# Patient Record
Sex: Male | Born: 1950
Health system: Southern US, Community
[De-identification: ages and names within clinical notes are randomized; demographics above are authoritative.]

## PROBLEM LIST (undated history)

## (undated) DIAGNOSIS — J449 Chronic obstructive pulmonary disease, unspecified: Secondary | ICD-10-CM

## (undated) DIAGNOSIS — M1990 Unspecified osteoarthritis, unspecified site: Secondary | ICD-10-CM

## (undated) DIAGNOSIS — F419 Anxiety disorder, unspecified: Secondary | ICD-10-CM

## (undated) DIAGNOSIS — D649 Anemia, unspecified: Secondary | ICD-10-CM

## (undated) DIAGNOSIS — I10 Essential (primary) hypertension: Secondary | ICD-10-CM

## (undated) DIAGNOSIS — T7840XA Allergy, unspecified, initial encounter: Secondary | ICD-10-CM

## (undated) DIAGNOSIS — E785 Hyperlipidemia, unspecified: Secondary | ICD-10-CM

## (undated) HISTORY — DX: Unspecified osteoarthritis, unspecified site: M19.90

## (undated) HISTORY — PX: HERNIA REPAIR: SHX51

## (undated) HISTORY — PX: COLON SURGERY: SHX602

## (undated) HISTORY — PX: EYE SURGERY: SHX253

## (undated) HISTORY — DX: Allergy, unspecified, initial encounter: T78.40XA

## (undated) HISTORY — DX: Anxiety disorder, unspecified: F41.9

## (undated) HISTORY — DX: Anemia, unspecified: D64.9

## (undated) HISTORY — DX: Chronic obstructive pulmonary disease, unspecified: J44.9

---

## 2011-05-08 ENCOUNTER — Emergency Department
Admission: EM | Admit: 2011-05-08 | Discharge: 2011-05-08 | Disposition: A | Discharge status: Home / Self Care | Payer: BC Managed Care – PPO | Source: Home / Self Care | Attending: Family Medicine | Admitting: Family Medicine

## 2011-05-08 DIAGNOSIS — J0100 Acute maxillary sinusitis, unspecified: Secondary | ICD-10-CM

## 2011-05-08 HISTORY — DX: Hyperlipidemia, unspecified: E78.5

## 2011-05-08 MED ORDER — AMOXICILLIN 875 MG PO TABS: 875.0000 mg | ORAL_TABLET | Freq: Two times a day (BID) | ORAL | Status: AC

## 2011-05-08 NOTE — Discharge Instructions (Signed)
Take plain Mucinex (guaifenesin) twice daily for cough and congestion.  Add Sudafed if blood pressure not affected.  Increase fluid intake, rest. May use Afrin nasal spray (or generic oxymetazoline) twice daily for about 5 days.  Also recommend using saline nasal spray several times daily and saline nasal irrigation (AYR is a common brand) Stop all antihistamines for now, and other non-prescription cough/cold preparations. May take Ibuprofen 200mg , 4 tabs every 8 hours with food as needed.    Marland Kitchen

## 2011-05-08 NOTE — ED Provider Notes (Signed)
History     CSN: [id number]  Arrival date & time 05/08/11  1627   First MD Initiated Contact with Patient 05/08/11 1751      Chief Complaint  Patient presents with  . Headache     HPI Comments: Patient complains of 5 day history of constant headache in his left temporal area and behind left eye.  He believes that he may have a sinus infection.  Two weeks prior to onset of headache he had a left upper toothache that resolved spontaneously.  He does not have definite sinus congestion, but has noted occasional "crackling" sensation behind his left eye at night.  No recent URI symptoms.  His left ear feels slightly clogged.  He had sweats two days ago.  He notes that Ibuprofen 600mg  is helpful.  No other neurologic symptoms.  The headache does not affect his sleep.    Patient is a 61 y.o. male presenting with headaches. The history is provided by the patient.  Headache The primary symptoms include headaches and fever. Primary symptoms do not include dizziness, visual change, paresthesias, focal weakness, loss of sensation, speech change, memory loss, nausea or vomiting. Episode onset: 5 days ago. The symptoms are unchanged. The neurological symptoms are focal.  The headache is associated with photophobia. The headache is not associated with aura, visual change, neck stiffness, paresthesias or weakness.  Additional symptoms include photophobia. Additional symptoms do not include neck stiffness, weakness, aura, nystagmus, taste disturbance or vertigo.    Past Medical History  Diagnosis Date  . Hyperlipidemia     Past Surgical History  Procedure Date  . Hernia repair     Family History  Problem Relation Age of Onset  . Hypertension Mother     History  Substance Use Topics  . Smoking status: Current Everyday Smoker -- 0.5 packs/day  . Smokeless tobacco: Not on file  . Alcohol Use: Yes      Review of Systems  Constitutional: Positive for fever and chills.  HENT: Positive for  congestion. Negative for facial swelling, rhinorrhea and neck stiffness.   Eyes: Positive for photophobia.  Gastrointestinal: Negative for nausea and vomiting.  Neurological: Positive for headaches. Negative for dizziness, vertigo, speech change, focal weakness, weakness and paresthesias.  Psychiatric/Behavioral: Negative for memory loss.  All other systems reviewed and are negative.    Allergies  Review of patient's allergies indicates no known allergies.  Home Medications   Current Outpatient Rx  Name Route Sig Dispense Refill  . ASPIRIN 81 MG PO TABS Oral Take 81 mg by mouth daily.    . AMOXICILLIN 875 MG PO TABS Oral Take 1 tablet (875 mg total) by mouth 2 (two) times daily. 20 tablet 0    BP 159/94  Pulse 95  Temp(Src) 99.2 F (37.3 C) (Oral)  Resp 20  Ht 6\' 4"  (1.93 m)  Wt 187 lb 8 oz (85.049 kg)  BMI 22.82 kg/m2  SpO2 96%  Physical Exam Nursing notes and Vital Signs reviewed. Appearance:  Patient appears healthy, stated age, and in no acute distress Head:  There is vague tenderness over left temporal area, although left temporal artery is not distinctly tender to palpation. Eyes:  Pupils are equal, round, and reactive to light and accomodation.  Extraocular movement is intact.  Conjunctivae are not inflamed.  Fundi are benign.  No photophobia.  Ears:  Canals normal.  Tympanic membranes normal.  No TMJ tenderness on left. Nose:  Congested turbinates.  Mild maxillary sinus tenderness is present.  Mouth:  No lesions.  No tooth or gingiva tenderness to palpation.  Teeth in good repair. Pharynx:  Normal Neck:  Supple.  No adenopathy Lungs:  Clear to auscultation.  Breath sounds are equal.  Heart:  Regular rate and rhythm without murmurs, rubs, or gallops.  Skin (head/neck):  No rash Neurologic:  Cranial nerves 2 through 12 are normal.  Patellar and elbow reflexes are normal.  Cerebellar function is intact (finger-to-nose and rapid alternating hand movement).  Gait and  station are normal.       ED Course  Procedures  none      1. Acute maxillary sinusitis   2. Left-sided headache       MDM  Patient declines sinus films at this time. Temporal arteritis is also a consideration; but patient declines sed rate at this time and wishes to try treatment for sinusitis. Begin amoxicillin for 10 days. Take plain Mucinex (guaifenesin) twice daily for cough and congestion.  Add Sudafed if blood pressure not affected.  Increase fluid intake, rest. May use Afrin nasal spray (or generic oxymetazoline) twice daily for about 5 days.  Also recommend using saline nasal spray several times daily and saline nasal irrigation (AYR is a common brand) Stop all antihistamines for now, and other non-prescription cough/cold preparations. May take Ibuprofen 200mg , 4 tabs every 8 hours with food as needed. Followup with ENT if not improved in one week.            Lattie Haw, MD 05/08/11 [phone]

## 2011-05-08 NOTE — ED Notes (Signed)
Headache since Sunday, rates pain 7/10 . States pain is behind left eye. Hx of sinus infection.

## 2011-05-10 ENCOUNTER — Telehealth: Payer: Self-pay | Admitting: Family Medicine

## 2012-01-29 ENCOUNTER — Encounter: Payer: Self-pay | Admitting: *Deleted

## 2012-01-29 ENCOUNTER — Emergency Department
Admission: EM | Admit: 2012-01-29 | Discharge: 2012-01-29 | Disposition: A | Discharge status: Home / Self Care | Payer: BC Managed Care – PPO | Source: Home / Self Care | Attending: Family Medicine | Admitting: Family Medicine

## 2012-01-29 DIAGNOSIS — J209 Acute bronchitis, unspecified: Secondary | ICD-10-CM

## 2012-01-29 MED ORDER — HYDROCOD POLST-CHLORPHEN POLST 10-8 MG/5ML PO LQCR: 5.0000 mL | Freq: Every evening | ORAL | Status: DC | PRN

## 2012-01-29 MED ORDER — DOXYCYCLINE HYCLATE 100 MG PO CAPS: 100.0000 mg | ORAL_CAPSULE | Freq: Two times a day (BID) | ORAL | Status: DC

## 2012-01-29 NOTE — ED Provider Notes (Signed)
History     CSN: [id number]  Arrival date & time 01/29/12  1616   First MD Initiated Contact with Patient 01/29/12 1729      Chief Complaint  Patient presents with  . Cough     HPI Comments: Patient complains of approximately 2 week history of gradually progressive URI symptoms beginning with a mild sore throat (now improved), followed by progressive nasal congestion.  A cough started next and has persisted.   Complains of initial fatigue but no myalgias.  Cough is now worse at night and generally non-productive during the day.  There has been no pleuritic pain, shortness of breath, or wheezes.   The history is provided by the patient.    Past Medical History  Diagnosis Date  . Hyperlipidemia     Past Surgical History  Procedure Date  . Hernia repair     Family History  Problem Relation Age of Onset  . Hypertension Mother     History  Substance Use Topics  . Smoking status: Current Every Day Smoker -- 0.5 packs/day  . Smokeless tobacco: Not on file  . Alcohol Use: Yes      Review of Systems No sore throat at present + cough No pleuritic pain No wheezing + nasal congestion ? post-nasal drainage No sinus pain/pressure No itchy/red eyes No earache No hemoptysis No SOB No fever/chills No nausea No vomiting No abdominal pain No diarrhea No urinary symptoms No skin rashes No fatigue No myalgias No headache   Allergies  Review of patient's allergies indicates no known allergies.  Home Medications   Current Outpatient Rx  Name  Route  Sig  Dispense  Refill  . ASPIRIN 81 MG PO TABS   Oral   Take 81 mg by mouth daily.         Marland Kitchen HYDROCOD POLST-CPM POLST ER 10-8 MG/5ML PO LQCR   Oral   Take 5 mLs by mouth at bedtime as needed.   115 mL   0   . DOXYCYCLINE HYCLATE 100 MG PO CAPS   Oral   Take 1 capsule (100 mg total) by mouth 2 (two) times daily.   20 capsule   0     BP 146/89  Pulse 74  Temp 98 F (36.7 C) (Oral)  Resp 18  Ht 6\' 4"   (1.93 m)  Wt 186 lb (84.369 kg)  BMI 22.64 kg/m2  SpO2 99%  Physical Exam Nursing notes and Vital Signs reviewed. Appearance:  Patient appears stated age, and in no acute distress Eyes:  Pupils are equal, round, and reactive to light and accomodation.  Extraocular movement is intact.  Conjunctivae are not inflamed  Ears:  Canals normal.  Tympanic membranes normal.  Nose:  Mildly congested turbinates.  No sinus tenderness.   Pharynx:  Normal Neck:  Supple.   No adenopathy Lungs:  Clear to auscultation.  Breath sounds are equal.  Heart:  Regular rate and rhythm without murmurs, rubs, or gallops.  Abdomen:  Nontender without masses or hepatosplenomegaly.  Bowel sounds are present.  No CVA or flank tenderness.  Extremities:  No edema.  Skin:  No rash present.   ED Course  Procedures  none      1. Acute bronchitis       MDM  Begin doxycycline.  Tussionex for bedtime cough. Take plain Mucinex (guaifenesin) twice daily for cough and congestion.  Increase fluid intake, rest. May use Afrin nasal spray (or generic oxymetazoline) twice daily for about 5 days.  Also recommend using saline nasal spray several times daily and saline nasal irrigation (AYR is a common brand) Stop all antihistamines for now, and other non-prescription cough/cold preparations. Follow-up with family doctor in about two weeks to re-check blood pressure.           Lattie Haw, MD 01/29/12 [phone]

## 2012-01-29 NOTE — ED Notes (Signed)
Pt c/o productive cough and chest congestion x 01/12/12. Denies fever. He has taken Robt with some relief.

## 2012-01-31 ENCOUNTER — Telehealth: Payer: Self-pay | Admitting: Emergency Medicine

## 2012-02-05 ENCOUNTER — Ambulatory Visit: Payer: BC Managed Care – PPO | Admitting: Sports Medicine

## 2012-03-22 ENCOUNTER — Ambulatory Visit (INDEPENDENT_AMBULATORY_CARE_PROVIDER_SITE_OTHER): Payer: BC Managed Care – PPO | Admitting: Sports Medicine

## 2012-03-22 ENCOUNTER — Emergency Department (INDEPENDENT_AMBULATORY_CARE_PROVIDER_SITE_OTHER): Payer: BC Managed Care – PPO

## 2012-03-22 ENCOUNTER — Encounter: Payer: Self-pay | Admitting: *Deleted

## 2012-03-22 ENCOUNTER — Emergency Department (INDEPENDENT_AMBULATORY_CARE_PROVIDER_SITE_OTHER)
Admission: EM | Admit: 2012-03-22 | Discharge: 2012-03-22 | Disposition: A | Discharge status: Home / Self Care | Payer: BC Managed Care – PPO | Source: Home / Self Care | Attending: Family Medicine | Admitting: Family Medicine

## 2012-03-22 DIAGNOSIS — S92354A Nondisplaced fracture of fifth metatarsal bone, right foot, initial encounter for closed fracture: Secondary | ICD-10-CM | POA: Insufficient documentation

## 2012-03-22 DIAGNOSIS — S92309A Fracture of unspecified metatarsal bone(s), unspecified foot, initial encounter for closed fracture: Secondary | ICD-10-CM

## 2012-03-22 DIAGNOSIS — S92919A Unspecified fracture of unspecified toe(s), initial encounter for closed fracture: Secondary | ICD-10-CM

## 2012-03-22 DIAGNOSIS — S92514A Nondisplaced fracture of proximal phalanx of right lesser toe(s), initial encounter for closed fracture: Secondary | ICD-10-CM | POA: Insufficient documentation

## 2012-03-22 DIAGNOSIS — S92911A Unspecified fracture of right toe(s), initial encounter for closed fracture: Secondary | ICD-10-CM

## 2012-03-22 DIAGNOSIS — W208XXA Other cause of strike by thrown, projected or falling object, initial encounter: Secondary | ICD-10-CM

## 2012-03-22 DIAGNOSIS — S92301A Fracture of unspecified metatarsal bone(s), right foot, initial encounter for closed fracture: Secondary | ICD-10-CM

## 2012-03-22 HISTORY — DX: Essential (primary) hypertension: I10

## 2012-03-22 MED ORDER — HYDROCODONE-ACETAMINOPHEN 5-325 MG PO TABS: 1.0000 | ORAL_TABLET | Freq: Three times a day (TID) | ORAL | Status: DC | PRN

## 2012-03-22 NOTE — Assessment & Plan Note (Addendum)
Vicodin, Cam Boot, nonweightbearing for now. Work note given. I would ideally like to see him back in 3-4 days and place a short leg cast, he unfortunately would like to come back in 2 weeks. I think this is acceptable, we'll check x-rays before the visit. 

## 2012-03-22 NOTE — Assessment & Plan Note (Signed)
Vicodin, Cam Boot, nonweightbearing for now. Work note given. I would ideally like to see him back in 3-4 days and place a short leg cast, he unfortunately would like to come back in 2 weeks. I think this is acceptable, we'll check x-rays before the visit.

## 2012-03-22 NOTE — Progress Notes (Signed)
SPORTS MEDICINE CONSULTATION REPORT  Subjective:    I'm seeing this patient as a consultation for:  Dr. 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 He had immediate pain, swelling, and bruising. He was unable to walk on it, and presented to urgent care for further evaluation. Pain is localized, doesn't radiate, is severe. X-rays were done that showed multiple fractures, and I was called for further evaluation and treatment.  Past medical history, Surgical history, Family history not pertinant except as noted below, Social history, Allergies, and medications have been entered into the medical record, reviewed, and no changes needed.   Review of Systems: No headache, visual changes, nausea, vomiting, diarrhea, constipation, dizziness, abdominal pain, skin rash, fevers, chills, night sweats, weight loss, swollen lymph nodes, body aches, joint swelling, muscle aches, chest pain, shortness of breath, mood changes, visual or auditory hallucinations.   Objective:   General: Well Developed, well nourished, and in no acute distress.  Neuro/Psych: Alert and oriented x3, extra-ocular muscles intact, able to move all 4 extremities, sensation grossly intact. Skin: Warm and dry, no rashes noted.  Respiratory: Not using accessory muscles, speaking in full sentences, trachea midline.  Cardiovascular: Pulses palpable, no extremity edema. Abdomen: Does not appear distended. Right foot is swollen, significantly bruised, with tenderness to palpation over the entire fifth metatarsal as well as proximal phalanx. He is neurovascularly intact distally.  I reviewed his x-rays, they show a comminuted, nondisplaced fracture of the fifth metatarsal bone, extra-articular. They also show a comminuted, intra-articular, minimally displaced fracture of the fifth proximal phalanx.  Impression and Recommendations:   This  case required medical decision making of moderate complexity.

## 2012-03-22 NOTE — ED Notes (Signed)
Patient reports dropping a ladder on his right foot last night. Edema and ecchymosis present. He did apply ice. No previous injury that he knows of.

## 2012-03-22 NOTE — ED Provider Notes (Signed)
History     CSN: [id number]  Arrival date & time 03/22/12  4098   First MD Initiated Contact with Patient 03/22/12 [phone]      Chief Complaint  Patient presents with  . Foot Injury    right foot      HPI Comments: Patient states that he dropped an aluminum ladder on his right foot last night, and has had persistent swelling.  Patient is a 62 y.o. male presenting with lower extremity pain. The history is provided by the patient.  Foot Pain This is a new problem. The current episode started yesterday. The problem occurs constantly. The problem has been gradually improving. Associated symptoms comments: none. The symptoms are aggravated by walking and standing. Nothing relieves the symptoms. Treatments tried: Ice pack. The treatment provided mild relief.    Past Medical History  Diagnosis Date  . Hyperlipidemia   . Hypertension     Past Surgical History  Procedure Date  . Hernia repair     Family History  Problem Relation Age of Onset  . Hypertension Mother   . Cancer Mother   . Cancer Father     prostate and stomach CA    History  Substance Use Topics  . Smoking status: Current Every Day Smoker -- 0.5 packs/day for 35 years  . Smokeless tobacco: Never Used  . Alcohol Use: Yes      Review of Systems  All other systems reviewed and are negative.    Allergies  Review of patient's allergies indicates no known allergies.  Home Medications   Current Outpatient Rx  Name  Route  Sig  Dispense  Refill  . ATORVASTATIN CALCIUM 20 MG PO TABS   Oral   Take 20 mg by mouth daily.         Marland Kitchen LOSARTAN POTASSIUM 50 MG PO TABS   Oral   Take 50 mg by mouth daily.         . ASPIRIN 81 MG PO TABS   Oral   Take 81 mg by mouth daily.         Marland Kitchen HYDROCODONE-ACETAMINOPHEN 5-325 MG PO TABS   Oral   Take 1 tablet by mouth every 8 (eight) hours as needed for pain.   40 tablet   0     BP 142/89  Pulse 73  Temp 98.1 F (36.7 C) (Oral)  Resp 16  Ht 6\' 4"  (1.93  m)  Wt 186 lb (84.369 kg)  BMI 22.64 kg/m2  SpO2 97%  Physical Exam  Nursing note and vitals reviewed. Constitutional: He is oriented to person, place, and time. He appears well-developed and well-nourished. No distress.  Eyes: Conjunctivae normal are normal. Pupils are equal, round, and reactive to light.  Musculoskeletal: Normal range of motion. He exhibits tenderness.       Right foot: He exhibits tenderness and swelling. He exhibits normal range of motion, no bony tenderness, normal capillary refill, no crepitus, no deformity and no laceration.       Feet:       There is tenderness dorsum right foot as noted on diagram.  Distal Neurovascular function is intact.   Neurological: He is alert and oriented to person, place, and time.    ED Course  Procedures none   Dg Foot Complete Right  03/22/2012  *RADIOLOGY REPORT*  Clinical Data: Pain post injury yesterday  RIGHT FOOT COMPLETE - 3+ VIEW  Comparison: None.  Findings: Three views of the right foot submitted.  There is  a comminuted nondisplaced fracture fifth metatarsal.  Mild displaced comminuted fracture proximal phalanx fifth toe.  IMPRESSION:  There is a comminuted nondisplaced fracture fifth metatarsal. Mild displaced comminuted fracture proximal phalanx fifth toe.   Original Report Authenticated By: Natasha Mead, M.D.      1. Closed fracture of metatarsal bone of right foot   2. Fracture of phalanx of right foot, closed       MDM  Will refer to Dr. Rodney Langton for management and followup        Lattie Haw, MD 03/22/12 [phone]

## 2012-04-04 ENCOUNTER — Ambulatory Visit (INDEPENDENT_AMBULATORY_CARE_PROVIDER_SITE_OTHER): Payer: BC Managed Care – PPO

## 2012-04-04 ENCOUNTER — Encounter: Payer: Self-pay | Admitting: Sports Medicine

## 2012-04-04 ENCOUNTER — Encounter: Payer: Self-pay | Admitting: *Deleted

## 2012-04-04 ENCOUNTER — Ambulatory Visit (INDEPENDENT_AMBULATORY_CARE_PROVIDER_SITE_OTHER): Payer: BC Managed Care – PPO | Admitting: Sports Medicine

## 2012-04-04 DIAGNOSIS — S92514A Nondisplaced fracture of proximal phalanx of right lesser toe(s), initial encounter for closed fracture: Secondary | ICD-10-CM

## 2012-04-04 DIAGNOSIS — S92919A Unspecified fracture of unspecified toe(s), initial encounter for closed fracture: Secondary | ICD-10-CM

## 2012-04-04 NOTE — Assessment & Plan Note (Signed)
2 weeks out from fracture, clinically improved greatly. I am not yet seeing signs of bony callus formation on x-rays. I would like him out of work for an additional week, with limited weightbearing. Stay in the CAM boot. Return in 2 weeks, x-ray before visit. I did strap the foot with compressive bandage.

## 2012-04-04 NOTE — Progress Notes (Signed)
Subjective: Keith Moses is now 2 weeks status post comminuted fractures of his right fifth metatarsal as well as proximal phalanx. He has been in a CAM boot, and pain is significantly improved. He is doing his best to do minimal weightbearing.   Objective:  General: Well-developed, well-nourished, and in no acute distress. Bruising is still present, swelling as well, but significantly improved since the last visit. He is neurovascularly intact distally.  I reviewed x-rays personally, they again show comminuted fractures through the fifth metatarsal as well as fifth proximal phalanx, there has been no further displacement although there is no callus formation seen yet.  Assessment/plan:

## 2012-04-11 ENCOUNTER — Encounter: Payer: Self-pay | Admitting: Sports Medicine

## 2012-04-18 ENCOUNTER — Ambulatory Visit (INDEPENDENT_AMBULATORY_CARE_PROVIDER_SITE_OTHER): Payer: BC Managed Care – PPO

## 2012-04-18 ENCOUNTER — Ambulatory Visit (INDEPENDENT_AMBULATORY_CARE_PROVIDER_SITE_OTHER): Payer: BC Managed Care – PPO | Admitting: Sports Medicine

## 2012-04-18 DIAGNOSIS — S92919A Unspecified fracture of unspecified toe(s), initial encounter for closed fracture: Secondary | ICD-10-CM

## 2012-04-18 DIAGNOSIS — S92514A Nondisplaced fracture of proximal phalanx of right lesser toe(s), initial encounter for closed fracture: Secondary | ICD-10-CM

## 2012-04-18 DIAGNOSIS — S92514D Nondisplaced fracture of proximal phalanx of right lesser toe(s), subsequent encounter for fracture with routine healing: Secondary | ICD-10-CM

## 2012-04-18 DIAGNOSIS — S92354D Nondisplaced fracture of fifth metatarsal bone, right foot, subsequent encounter for fracture with routine healing: Secondary | ICD-10-CM

## 2012-04-18 NOTE — Assessment & Plan Note (Signed)
4 weeks out from fracture, clinically greatly improved. He is able to bear weight. Bony callus is now visible on x-rays although his smoking is certainly slowing down healing. I'm going to transition him into the postop shoe, and I would like to see him back in one month. I do not need any further x-rays. 

## 2012-04-18 NOTE — Progress Notes (Signed)
Subjective: 4 weeks out from comminuted fractures of the right fifth proximal phalanx as well as the right fifth metatarsal. Almost pain-free, able to ambulate.   Objective:  General: Well-developed, well-nourished, and in no acute distress. Bruising has resolved, he is neurovascularly intact distally, overall nontender over the fracture site. He is able to bear 100% of his weight on the fractured foot. I transitioned him into the postop shoe.  Assessment/plan:

## 2012-04-18 NOTE — Assessment & Plan Note (Signed)
4 weeks out from fracture, clinically greatly improved. He is able to bear weight. Bony callus is now visible on x-rays although his smoking is certainly slowing down healing. I'm going to transition him into the postop shoe, and I would like to see him back in one month. I do not need any further x-rays.

## 2012-05-14 ENCOUNTER — Emergency Department (INDEPENDENT_AMBULATORY_CARE_PROVIDER_SITE_OTHER): Payer: BC Managed Care – PPO

## 2012-05-14 ENCOUNTER — Encounter: Payer: Self-pay | Admitting: *Deleted

## 2012-05-14 ENCOUNTER — Emergency Department (INDEPENDENT_AMBULATORY_CARE_PROVIDER_SITE_OTHER)
Admission: EM | Admit: 2012-05-14 | Discharge: 2012-05-14 | Disposition: A | Discharge status: Other Acute Inpatient Hospital | Payer: BC Managed Care – PPO | Source: Home / Self Care | Attending: Family Medicine | Admitting: Family Medicine

## 2012-05-14 DIAGNOSIS — R071 Chest pain on breathing: Secondary | ICD-10-CM

## 2012-05-14 DIAGNOSIS — R05 Cough: Secondary | ICD-10-CM

## 2012-05-14 DIAGNOSIS — F17200 Nicotine dependence, unspecified, uncomplicated: Secondary | ICD-10-CM

## 2012-05-14 DIAGNOSIS — R079 Chest pain, unspecified: Secondary | ICD-10-CM

## 2012-05-14 DIAGNOSIS — R0789 Other chest pain: Secondary | ICD-10-CM

## 2012-05-14 DIAGNOSIS — I447 Left bundle-branch block, unspecified: Secondary | ICD-10-CM

## 2012-05-14 NOTE — ED Notes (Signed)
Pt c/o soreness, tenderness, SOB on right side of chest since Wednesday. Has tried icing it.

## 2012-05-14 NOTE — ED Provider Notes (Signed)
History     CSN: [id number]  Arrival date & time 05/14/12  1040   First MD Initiated Contact with Patient 05/14/12 1113      Chief Complaint  Patient presents with  . Shortness of Breath      HPI Comments: Patient complains of onset of coughing/sneezing three days ago without nasal congestion, sore throat, or fever.  That evening he developed pleuritic pain in his left anterior/inferior chest with shortness of breath but no wheezing.  The pain does not radiate. Patient states that he had an abnormal cardiac cath in March 2012, revealing partial occlusion in 3 vessels with one vessel approximately 69% occluded. He continues to smoke  Patient is a 62 y.o. male presenting with chest pain. The history is provided by the patient.  Chest Pain Pain location:  L chest Pain quality: sharp   Pain radiates to:  Does not radiate Pain radiates to the back: no   Pain severity:  Moderate Onset quality:  Sudden Duration:  3 days Timing:  Constant Progression:  Unchanged Chronicity:  New Context: breathing, lifting, raising an arm and at rest   Context: not eating and no trauma   Relieved by:  Nothing Worsened by:  Nothing tried Ineffective treatments:  None tried Associated symptoms: cough, fatigue and shortness of breath   Associated symptoms: no back pain, no diaphoresis, no dizziness, no dysphagia, no fever, no headache, no heartburn, no lower extremity edema, no nausea, no near-syncope, no numbness, no palpitations, no syncope and not vomiting   Risk factors: coronary artery disease and high cholesterol     Past Medical History  Diagnosis Date  . Hyperlipidemia   . Hypertension     Past Surgical History  Procedure Laterality Date  . Hernia repair      Family History  Problem Relation Age of Onset  . Hypertension Mother   . Cancer Mother   . Cancer Father     prostate and stomach CA    History  Substance Use Topics  . Smoking status: Current Every Day Smoker -- 0.50  packs/day for 35 years  . Smokeless tobacco: Never Used  . Alcohol Use: Yes      Review of Systems  Constitutional: Positive for fatigue. Negative for fever and diaphoresis.  HENT: Negative for trouble swallowing.   Respiratory: Positive for cough and shortness of breath.   Cardiovascular: Positive for chest pain. Negative for palpitations, syncope and near-syncope.  Gastrointestinal: Negative for heartburn, nausea and vomiting.  Musculoskeletal: Negative for back pain.  Neurological: Negative for dizziness, numbness and headaches.    Allergies  Review of patient's allergies indicates no known allergies.  Home Medications   Current Outpatient Rx  Name  Route  Sig  Dispense  Refill  . aspirin 81 MG tablet   Oral   Take 81 mg by mouth daily.         Marland Kitchen atorvastatin (LIPITOR) 20 MG tablet   Oral   Take 20 mg by mouth daily.         Marland Kitchen HYDROcodone-acetaminophen (NORCO/VICODIN) 5-325 MG per tablet   Oral   Take 1 tablet by mouth every 8 (eight) hours as needed for pain.   40 tablet   0   . losartan (COZAAR) 50 MG tablet   Oral   Take 50 mg by mouth daily.           BP 139/73  Pulse 87  Temp(Src) 98.4 F (36.9 C) (Oral)  Resp 26  Ht  6\' 4"  (1.93 m)  Wt 190 lb 8 oz (86.41 kg)  BMI 23.2 kg/m2  SpO2 99%  Physical Exam  Nursing note and vitals reviewed. Constitutional: He is oriented to person, place, and time. He appears well-developed and well-nourished. No distress.  HENT:  Head: Normocephalic.  Mouth/Throat: Oropharynx is clear and moist.  Eyes: Conjunctivae are normal. Pupils are equal, round, and reactive to light.  Neck: Neck supple.  Cardiovascular: Normal rate, regular rhythm, normal heart sounds and intact distal pulses.   Pulmonary/Chest: Effort normal and breath sounds normal. No respiratory distress. He has no wheezes. He has no rales. He exhibits no tenderness.    Area of minimal tenderness left anterior chest as noted on diagram  Abdominal:  Soft. There is no tenderness.  Musculoskeletal: He exhibits no edema.  Lymphadenopathy:    He has no cervical adenopathy.  Neurological: He is alert and oriented to person, place, and time.  Skin: Skin is warm and dry. No rash noted. He is not diaphoretic.    ED Course  Procedures  none  EKG:  Left Bundle Branch Block; NSR  Dg Ribs Unilateral W/chest Left  05/14/2012  *RADIOLOGY REPORT*  Clinical Data: Cough, left rib pain  LEFT RIBS AND CHEST - 3+ VIEW  Comparison: None.  Findings: Four views left ribs submitted.  Cardiomediastinal silhouette is unremarkable.  No acute infiltrate or pulmonary edema.  No left rib fracture is identified.  No diagnostic pneumothorax.  IMPRESSION: No left rib fracture.  No diagnostic pneumothorax.   Original Report Authenticated By: Natasha Mead, M.D.      1. Left-sided chest wall pain, probably musculoskeletal but could also represent recent cardiac injury.   2. Left bundle branch block; probably pre-existing      MDM  Patient has significant cardiac risk factors including past 3-vessel partial occlusion, hyperlipidemia, and smoking history. Preferred to transport patient to local ER by rescue squad but patient refuses.  He agrees to proceed immediately to [facility] ER (wife to drive) for further evaluation        Lattie Haw, MD 05/14/12 1401

## 2012-05-16 ENCOUNTER — Encounter: Payer: Self-pay | Admitting: Sports Medicine

## 2012-05-16 ENCOUNTER — Ambulatory Visit (INDEPENDENT_AMBULATORY_CARE_PROVIDER_SITE_OTHER): Payer: BC Managed Care – PPO | Admitting: Sports Medicine

## 2012-05-16 VITALS — BP 138/77 | HR 73 | Wt 193.0 lb

## 2012-05-16 DIAGNOSIS — S92354D Nondisplaced fracture of fifth metatarsal bone, right foot, subsequent encounter for fracture with routine healing: Secondary | ICD-10-CM

## 2012-05-16 DIAGNOSIS — S92919A Unspecified fracture of unspecified toe(s), initial encounter for closed fracture: Secondary | ICD-10-CM

## 2012-05-16 DIAGNOSIS — S92514D Nondisplaced fracture of proximal phalanx of right lesser toe(s), subsequent encounter for fracture with routine healing: Secondary | ICD-10-CM

## 2012-05-16 NOTE — Progress Notes (Signed)
  Subjective:  Arnet is 8 weeks status post right fifth proximal phalanx and metatarsal fractures. He's been in a boot and a postop shoe. He is overall pain-free.   Objective: General: Well-developed, well-nourished, and in no acute distress. Foot is unremarkable to inspection there is no longer any tenderness over the fracture site.  Assessment/plan:

## 2012-05-16 NOTE — Assessment & Plan Note (Signed)
8 weeks out. Fully healed. No pain at fracture site. Into regular shoe, return as needed. Note written for full duty. 

## 2012-05-16 NOTE — Assessment & Plan Note (Signed)
8 weeks out. Fully healed. No pain at fracture site. Into regular shoe, return as needed. Note written for full duty.

## 2012-12-20 ENCOUNTER — Ambulatory Visit (INDEPENDENT_AMBULATORY_CARE_PROVIDER_SITE_OTHER): Payer: BC Managed Care – PPO | Admitting: Sports Medicine

## 2012-12-20 ENCOUNTER — Encounter: Payer: Self-pay | Admitting: Sports Medicine

## 2012-12-20 VITALS — BP 122/69 | HR 73 | Wt 181.0 lb

## 2012-12-20 DIAGNOSIS — L600 Ingrowing nail: Secondary | ICD-10-CM | POA: Insufficient documentation

## 2012-12-20 DIAGNOSIS — M722 Plantar fascial fibromatosis: Secondary | ICD-10-CM

## 2012-12-20 DIAGNOSIS — B351 Tinea unguium: Secondary | ICD-10-CM | POA: Insufficient documentation

## 2012-12-20 MED ORDER — MELOXICAM 15 MG PO TABS: ORAL_TABLET | ORAL | Status: DC

## 2012-12-20 MED ORDER — TERBINAFINE HCL 250 MG PO TABS: 250.0000 mg | ORAL_TABLET | Freq: Every day | ORAL | Status: DC

## 2012-12-20 NOTE — Assessment & Plan Note (Signed)
Lamisil for 3-6 months. 

## 2012-12-20 NOTE — Progress Notes (Signed)
  Subjective:    CC: Foot pain  HPI: Fractures: Keith Moses is about 9 months outside of comminuted fractures of his right fifth metatarsal bone, as well as right fifth proximal phalanx, these are completely pain-free.  Ingrown toenail: Right great toenail, ingrown on the medial and lateral aspects, painful, moderate, persistent.  Heel painful at present for several weeks, localize the calcaneal insertion of the plantar fascia, worse with the first few steps in the morning, moderate, persistent, no radiation.  Past medical history, Surgical history, Family history not pertinant except as noted below, Social history, Allergies, and medications have been entered into the medical record, reviewed, and no changes needed.   Review of Systems: No fevers, chills, night sweats, weight loss, chest pain, or shortness of breath.   Objective:    General: Well Developed, well nourished, and in no acute distress.  Neuro: Alert and oriented x3, extra-ocular muscles intact, sensation grossly intact.  HEENT: Normocephalic, atraumatic, pupils equal round reactive to light, neck supple, no masses, no lymphadenopathy, thyroid nonpalpable.  Skin: Warm and dry, no rashes. Cardiac: Regular rate and rhythm, no murmurs rubs or gallops, no lower extremity edema.  Respiratory: Clear to auscultation bilaterally. Not using accessory muscles, speaking in full sentences. Right Foot: No visible erythema or swelling. Onychomycosis noted nails are thick and yellow, also with ingrown first toenail. No signs of acute paronychia, hyponychia, or eponychia. Range of motion is full in all directions. Strength is 5/5 in all directions. No hallux valgus. Bilateral pes cavus. No abnormal callus noted. No pain over the navicular prominence, or base of fifth metatarsal. Tender to palpation of the calcaneal insertion of plantar fascia. No pain at the Achilles insertion. No pain over the calcaneal bursa. No pain of the  retrocalcaneal bursa. No tenderness to palpation over the tarsals, metatarsals, or phalanges. No hallux rigidus or limitus. No tenderness palpation over interphalangeal joints. No pain with compression of the metatarsal heads. Neurovascularly intact distally.  Impression and Recommendations:

## 2012-12-20 NOTE — Assessment & Plan Note (Signed)
Starting conservatively with heel cups, plantar fascia exercises, he will return for custom orthotics. Mobic. After one month of conservative measures we will do an injection if no better.

## 2012-12-20 NOTE — Patient Instructions (Signed)
Ringworm, Nail A fungal infection of the nail (tinea unguium/onychomycosis) is common. It is common as the visible part of the nail is composed of dead cells which have no blood supply to help prevent infection. It occurs because fungi are everywhere and will pick any opportunity to grow on any dead material. Because nails are very slow growing they require up to 2 years of treatment with anti-fungal medications. The entire nail back to the base is infected. This includes approximately  of the nail which you cannot see. If your caregiver has prescribed a medication by mouth, take it every day and as directed. No progress will be seen for at least 6 to 9 months. Do not be disappointed! Because fungi live on dead cells with little or no exposure to blood supply, medication delivery to the infection is slow; thus the cure is slow. It is also why you can observe no progress in the first 6 months. The nail becoming cured is the base of the nail, as it has the blood supply. Topical medication such as creams and ointments are usually not effective. Important in successful treatment of nail fungus is closely following the medication regimen that your doctor prescribes. Sometimes you and your caregiver may elect to speed up this process by surgical removal of all the nails. Even this may still require 6 to 9 months of additional oral medications. See your caregiver as directed. Remember there will be no visible improvement for at least 6 months. See your caregiver sooner if other signs of infection (redness and swelling) develop. Document Released: 02/07/2000 Document Revised: 05/04/2011 Document Reviewed: 04/17/2008 [employer] Patient Information 2014 [employer].  

## 2012-12-20 NOTE — Assessment & Plan Note (Signed)
Patient will schedule appointment for removal of the entire right great toenail.

## 2012-12-22 ENCOUNTER — Ambulatory Visit (INDEPENDENT_AMBULATORY_CARE_PROVIDER_SITE_OTHER): Payer: BC Managed Care – PPO | Admitting: Sports Medicine

## 2012-12-22 ENCOUNTER — Encounter: Payer: Self-pay | Admitting: Sports Medicine

## 2012-12-22 VITALS — BP 131/77 | HR 67 | Wt 185.0 lb

## 2012-12-22 DIAGNOSIS — B351 Tinea unguium: Secondary | ICD-10-CM

## 2012-12-22 DIAGNOSIS — L600 Ingrowing nail: Secondary | ICD-10-CM

## 2012-12-22 DIAGNOSIS — M722 Plantar fascial fibromatosis: Secondary | ICD-10-CM

## 2012-12-22 NOTE — Progress Notes (Signed)
    Patient was fitted for a : standard, cushioned, semi-rigid orthotic. The orthotic was heated and afterward the patient stood on the orthotic blank positioned on the orthotic stand. The patient was positioned in subtalar neutral position and 10 degrees of ankle dorsiflexion in a weight bearing stance. After completion of molding, a stable base was applied to the orthotic blank. The blank was ground to a stable position for weight bearing. Size: 16 Base: Blue EVA Additional Posting and Padding: None The patient ambulated these, and they were very comfortable.  I spent 40 minutes with this patient, greater than 50% was face-to-face time counseling regarding the below diagnosis.

## 2012-12-22 NOTE — Assessment & Plan Note (Signed)
Continue Lamisil for 3-6 months.

## 2012-12-22 NOTE — Assessment & Plan Note (Signed)
Custom orthotics as above. Continue home exercises. He did tell me that the toe box was a little bit tight, I offered to grind down the orthotic to slim the forefoot section. He will try the shoes out, hopefully they will stretch, if they do not feel better in approximately one week, I am happy to slim down the forefoot section of the orthotic.

## 2012-12-22 NOTE — Assessment & Plan Note (Signed)
Keith Moses will set up bilateral great toenail removal for next Friday.

## 2012-12-30 ENCOUNTER — Ambulatory Visit: Payer: BC Managed Care – PPO | Admitting: Sports Medicine

## 2012-12-30 ENCOUNTER — Encounter: Payer: Self-pay | Admitting: Sports Medicine

## 2012-12-30 VITALS — BP 146/85 | HR 72 | Wt 185.0 lb

## 2012-12-30 DIAGNOSIS — L600 Ingrowing nail: Secondary | ICD-10-CM

## 2012-12-30 MED ORDER — OXYCODONE-ACETAMINOPHEN 5-325 MG PO TABS: 1.0000 | ORAL_TABLET | Freq: Three times a day (TID) | ORAL | Status: DC | PRN

## 2012-12-30 NOTE — Assessment & Plan Note (Signed)
Removed right great and fourth toenails. Normal the left great toenail at future visit.

## 2012-12-30 NOTE — Progress Notes (Signed)
  Procedure:  Removal of right first toenail.  Risks, benefits, alternatives explained to patient. Consent obtained. Time out conducted. Noted no overlying induration or erythema at site of injection. Toe cleaned with alcohol, then a total of 6 cc lidocaine 2% infiltrated at adjacent webspaces at the location of the bifurcation of the common digital nerve to proper digital nerves, I also infiltrated some lidocaine on the dorsum of the toe.  Some lidocaine also infiltrated at hyponychium and under nail bed.  Adequate anesthesia ensured. Toe prepped and draped in a sterile fashion. Nail elevator used to separate nail plate from nail bed. Hemostat then used to separate nail from the bed. Antibiotic ointment applied. Toe dressed. Advised to return if increased redness, swelling, drainage, fevers, or chills.  Procedure:  Removal of right fourth toenail.  Risks, benefits, alternatives explained to patient. Consent obtained. Time out conducted. Noted no overlying induration or erythema at site of injection. Toe cleaned with alcohol, then a total of 4 cc lidocaine 2% infiltrated at adjacent webspaces at the location of the bifurcation of the common digital nerve to proper digital nerves.  Some lidocaine also infiltrated at hyponychium and under nail bed.  Adequate anesthesia ensured. Toe prepped and draped in a sterile fashion. Nail elevator used to separate nail plate from nail bed. Hemostat then used to separate nail from the bed. Minor bleeding controlled. Antibiotic ointment applied. Toe dressed. Advised to return if increased redness, swelling, drainage, fevers, or chills.

## 2013-01-06 ENCOUNTER — Ambulatory Visit: Payer: BC Managed Care – PPO | Admitting: Sports Medicine

## 2013-01-06 ENCOUNTER — Encounter: Payer: Self-pay | Admitting: Sports Medicine

## 2013-01-06 VITALS — BP 159/89 | HR 82 | Wt 187.0 lb

## 2013-01-06 DIAGNOSIS — L600 Ingrowing nail: Secondary | ICD-10-CM

## 2013-01-06 NOTE — Progress Notes (Signed)
  Subjective: 7 days status post nail plate excisions of the right first and fourth toenails. Did have a significant amount of pain after the procedure, uncontrolled with his narcotic. Overall doing much better.   Objective: General: Well-developed, well-nourished, and in no acute distress. Wound is examined, clean, dry, intact, no signs of infection, hemostatic.  Assessment/plan:

## 2013-01-06 NOTE — Assessment & Plan Note (Signed)
Doing well after nail plate excisions. He does have an additional nail plate on the other foot that needs to be excised, he would like to wait a good amount of time before doing this. Because his postoperative pain was so extensive, we would likely need to prescribe some Valium preoperatively, as well as hydromorphone postoperatively. He will let us know when he is ready for this.

## 2013-02-21 ENCOUNTER — Ambulatory Visit (INDEPENDENT_AMBULATORY_CARE_PROVIDER_SITE_OTHER): Payer: BC Managed Care – PPO | Admitting: Sports Medicine

## 2013-02-21 ENCOUNTER — Encounter: Payer: Self-pay | Admitting: Sports Medicine

## 2013-02-21 VITALS — BP 121/68 | HR 72 | Wt 187.0 lb

## 2013-02-21 DIAGNOSIS — M722 Plantar fascial fibromatosis: Secondary | ICD-10-CM

## 2013-02-21 NOTE — Assessment & Plan Note (Signed)
Ultrasound guided injection as above. Continue rehabilitation and return in one month to see how things are doing.orthotics.

## 2013-02-21 NOTE — Progress Notes (Signed)
  Subjective:    CC: Follow up  HPI: Plantar fasciitis, right: has been doing his rehabilitation occasionally, is wearing custom orthotics, unfortunately continues to have pain he localizes the calcaneal insertion of the plantar fascia, worse with the first few steps in the morning, better with rest, moderate, persistent, no radiation.  Past medical history, Surgical history, Family history not pertinant except as noted below, Social history, Allergies, and medications have been entered into the medical record, reviewed, and no changes needed.   Review of Systems: No fevers, chills, night sweats, weight loss, chest pain, or shortness of breath.   Objective:    General: Well Developed, well nourished, and in no acute distress.  Neuro: Alert and oriented x3, extra-ocular muscles intact, sensation grossly intact.  HEENT: Normocephalic, atraumatic, pupils equal round reactive to light, neck supple, no masses, no lymphadenopathy, thyroid nonpalpable.  Skin: Warm and dry, no rashes. Cardiac: Regular rate and rhythm, no murmurs rubs or gallops, no lower extremity edema.  Respiratory: Clear to auscultation bilaterally. Not using accessory muscles, speaking in full sentences. Right Foot: No visible erythema or swelling. Range of motion is full in all directions. Strength is 5/5 in all directions. No hallux valgus. No pes cavus or pes planus. No abnormal callus noted. No pain over the navicular prominence, or base of fifth metatarsal. Tender to palpation of the calcaneal insertion of plantar fascia. No pain at the Achilles insertion. No pain over the calcaneal bursa. No pain of the retrocalcaneal bursa. No tenderness to palpation over the tarsals, metatarsals, or phalanges. No hallux rigidus or limitus. No tenderness palpation over interphalangeal joints. No pain with compression of the metatarsal heads. Neurovascularly intact distally.  Procedure: Real-time Ultrasound Guided Injection of  right plantar fascia Device: GE Logiq E  Verbal informed consent obtained.  Time-out conducted.  Noted no overlying erythema, induration, or other signs of local infection.  Skin prepped in a sterile fashion.  Local anesthesia: Topical Ethyl chloride.  With sterile technique and under real time ultrasound guidance:  25-gauge needle advanced just deep to the calcaneal insertion of the plantar fascia, 1 cc Kenalog 40, 4 cc lidocaine injected easily. Completed without difficulty  Pain immediately resolved suggesting accurate placement of the medication.  Advised to call if fevers/chills, erythema, induration, drainage, or persistent bleeding.  Images permanently stored and available for review in the ultrasound unit.  Impression: Technically successful ultrasound guided injection.  Impression and Recommendations:

## 2013-03-24 ENCOUNTER — Encounter: Payer: Self-pay | Admitting: Sports Medicine

## 2013-03-24 ENCOUNTER — Ambulatory Visit (INDEPENDENT_AMBULATORY_CARE_PROVIDER_SITE_OTHER): Payer: BC Managed Care – PPO | Admitting: Sports Medicine

## 2013-03-24 VITALS — BP 135/75 | HR 89 | Ht 76.0 in | Wt 183.0 lb

## 2013-03-24 DIAGNOSIS — F411 Generalized anxiety disorder: Secondary | ICD-10-CM | POA: Insufficient documentation

## 2013-03-24 NOTE — Assessment & Plan Note (Signed)
We discussed the etiology and pathophysiology of anxiety and monoamine hypothesis. It is likely that we will start citalopram, he would like to discuss this in several months and would like to switch care here.

## 2013-03-24 NOTE — Progress Notes (Signed)
  Subjective:    CC: Followup  HPI: Plantar fasciitis: Pain-free after injection and orthotics.  Work-related stress: For many weeks now he has had excessive worry, inability to sleep, mild irritability, feelings of doom, anxiety. He does desire to switch care here.  Past medical history, Surgical history, Family history not pertinant except as noted below, Social history, Allergies, and medications have been entered into the medical record, reviewed, and no changes needed.   Review of Systems: No fevers, chills, night sweats, weight loss, chest pain, or shortness of breath.   Objective:    General: Well Developed, well nourished, and in no acute distress.  Neuro: Alert and oriented x3, extra-ocular muscles intact, sensation grossly intact.  HEENT: Normocephalic, atraumatic, pupils equal round reactive to light, neck supple, no masses, no lymphadenopathy, thyroid nonpalpable.  Skin: Warm and dry, no rashes. Cardiac: Regular rate and rhythm, no murmurs rubs or gallops, no lower extremity edema.  Respiratory: Clear to auscultation bilaterally. Not using accessory muscles, speaking in full sentences. Right Foot: No visible erythema or swelling. Range of motion is full in all directions. Strength is 5/5 in all directions. No hallux valgus. No pes cavus or pes planus. No abnormal callus noted. No pain over the navicular prominence, or base of fifth metatarsal. No tenderness to palpation of the calcaneal insertion of plantar fascia. No pain at the Achilles insertion. No pain over the calcaneal bursa. No pain of the retrocalcaneal bursa. No tenderness to palpation over the tarsals, metatarsals, or phalanges. No hallux rigidus or limitus. No tenderness palpation over interphalangeal joints. No pain with compression of the metatarsal heads. Neurovascularly intact distally. Impression and Recommendations:

## 2013-04-18 ENCOUNTER — Other Ambulatory Visit: Payer: Self-pay | Admitting: Sports Medicine

## 2013-04-19 NOTE — Telephone Encounter (Signed)
Has this patient transferred to you? The PCP listed is Smith Mince?

## 2013-04-27 ENCOUNTER — Encounter: Payer: Self-pay | Admitting: Emergency Medicine

## 2013-04-27 ENCOUNTER — Emergency Department (INDEPENDENT_AMBULATORY_CARE_PROVIDER_SITE_OTHER)
Admission: EM | Admit: 2013-04-27 | Discharge: 2013-04-27 | Disposition: A | Discharge status: Home / Self Care | Payer: BC Managed Care – PPO | Source: Home / Self Care | Attending: Emergency Medicine | Admitting: Emergency Medicine

## 2013-04-27 DIAGNOSIS — J209 Acute bronchitis, unspecified: Secondary | ICD-10-CM

## 2013-04-27 MED ORDER — AZITHROMYCIN 250 MG PO TABS: ORAL_TABLET | ORAL | Status: DC

## 2013-04-27 MED ORDER — PROMETHAZINE-CODEINE 6.25-10 MG/5ML PO SYRP: ORAL_SOLUTION | ORAL | Status: DC

## 2013-04-27 NOTE — ED Provider Notes (Signed)
CSN: [id number]     Arrival date & time 04/27/13  1544 History   First MD Initiated Contact with Patient 04/27/13 1610     Chief Complaint  Patient presents with  . Sinus Problem   (Consider location/radiation/quality/duration/timing/severity/associated sxs/prior Treatment) HPI URI HISTORY  Keith Moses is a 63 y.o. male who complains of onset of cold symptoms for 3 days.  Have been using over-the-counter treatment which helps a little bit.--Symptoms are progressively worsening  No chills/sweats +  Fever  +  Nasal congestion +  Discolored Post-nasal drainage Mild sinus pain/pressure No sore throat  +  Hacking, nonproductive cough, which is worse at night and keeps him up at night No wheezing Mild chest congestion No hemoptysis No shortness of breath No pleuritic pain  No itchy/red eyes No earache  No nausea No vomiting No abdominal pain No diarrhea  No skin rashes +  Fatigue No myalgias No headache   He smokes one pack a day, and I advised him to quit smoking  Past Medical History  Diagnosis Date  . Hyperlipidemia   . Hypertension    Past Surgical History  Procedure Laterality Date  . Hernia repair     Family History  Problem Relation Age of Onset  . Hypertension Mother   . Cancer Mother   . Cancer Father     prostate and stomach CA   History  Substance Use Topics  . Smoking status: Current Every Day Smoker -- 0.50 packs/day for 35 years  . Smokeless tobacco: Never Used  . Alcohol Use: Yes    Review of Systems  All other systems reviewed and are negative.    Allergies  Review of patient's allergies indicates not on file.  Home Medications   Current Outpatient Rx  Name  Route  Sig  Dispense  Refill  . aspirin 81 MG tablet   Oral   Take 81 mg by mouth daily.         Marland Kitchen atorvastatin (LIPITOR) 20 MG tablet   Oral   Take 20 mg by mouth daily.         Marland Kitchen azithromycin (ZITHROMAX Z-PAK) 250 MG tablet      Take 2 tablets on day one, then 1  tablet daily on days 2 through 5   1 each   0   . losartan (COZAAR) 50 MG tablet   Oral   Take 50 mg by mouth daily.         . meloxicam (MOBIC) 15 MG tablet      ONE TAB BY MOUTH EVERY MORNING WITH BREAKFAST FOR 2 WEEKS, THEN DAILY AS NEEDED FOR PAIN.   30 tablet   3   . promethazine-codeine (PHENERGAN WITH CODEINE) 6.25-10 MG/5ML syrup      Take 1-2 teaspoons every 4-6 hours as needed for cough. May cause drowsiness.   120 mL   0   . terbinafine (LAMISIL) 250 MG tablet      TAKE 1 TABLET (250 MG TOTAL) BY MOUTH DAILY.   90 tablet   0    BP 131/84  Pulse 82  Temp(Src) 99.1 F (37.3 C) (Oral)  Ht 6\' 4"  (1.93 m)  Wt 181 lb (82.101 kg)  BMI 22.04 kg/m2  SpO2 98% Physical Exam  Nursing note and vitals reviewed. Constitutional: He is oriented to person, place, and time. He appears well-developed and well-nourished. No distress.  HENT:  Head: Normocephalic and atraumatic.  Right Ear: Tympanic membrane, external ear and ear canal normal.  Left Ear: Tympanic membrane, external ear and ear canal normal.  Nose: Mucosal edema and rhinorrhea present. Right sinus exhibits maxillary sinus tenderness. Left sinus exhibits maxillary sinus tenderness.  Mouth/Throat: Oropharynx is clear and moist. No oral lesions. No oropharyngeal exudate.  Eyes: Right eye exhibits no discharge. Left eye exhibits no discharge. No scleral icterus.  Neck: Neck supple.  Cardiovascular: Normal rate, regular rhythm and normal heart sounds.   Pulmonary/Chest: Effort normal. No respiratory distress. He has no wheezes. He has rhonchi. He has no rales.  Lymphadenopathy:    He has no cervical adenopathy.  Neurological: He is alert and oriented to person, place, and time.  Skin: Skin is warm and dry. No rash noted.  Psychiatric: He has a normal mood and affect.    ED Course  Procedures (including critical care time) Labs Review Labs Reviewed - No data to display Imaging Review No results  found.   MDM   1. Acute bronchitis    and acute sinusitis  Treatment options discussed, as well as risks, benefits, alternatives. Patient voiced understanding and agreement with the following plans: Z-Pak Phenergan With Codeine, 1 or 2 teaspoons at bedtime when necessary cough. Precautions discussed Other symptomatic care discussed Advised to quit smoking Precautions discussed. Red flags discussed. Questions invited and answered. Patient voiced understanding and agreement.     Jacqulyn Cane, MD 04/27/13 [phone]

## 2013-04-27 NOTE — ED Notes (Signed)
Fever, chills, sinus pain, pressure, headache, cough x 4 days

## 2013-07-24 ENCOUNTER — Emergency Department (INDEPENDENT_AMBULATORY_CARE_PROVIDER_SITE_OTHER)
Admission: EM | Admit: 2013-07-24 | Discharge: 2013-07-24 | Disposition: A | Discharge status: Home / Self Care | Payer: BC Managed Care – PPO | Source: Home / Self Care | Attending: Family Medicine | Admitting: Family Medicine

## 2013-07-24 ENCOUNTER — Encounter: Payer: Self-pay | Admitting: Emergency Medicine

## 2013-07-24 ENCOUNTER — Ambulatory Visit: Payer: BC Managed Care – PPO | Admitting: Physician Assistant

## 2013-07-24 DIAGNOSIS — H18892 Other specified disorders of cornea, left eye: Secondary | ICD-10-CM

## 2013-07-24 DIAGNOSIS — H18899 Other specified disorders of cornea, unspecified eye: Secondary | ICD-10-CM

## 2013-07-24 MED ORDER — POLYMYXIN B-TRIMETHOPRIM 10000-0.1 UNIT/ML-% OP SOLN: 1.0000 [drp] | OPHTHALMIC | Status: DC

## 2013-07-24 NOTE — ED Notes (Signed)
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.  Wearing safety glasses.

## 2013-07-24 NOTE — Discharge Instructions (Signed)
Wear sunglasses outside.

## 2013-07-24 NOTE — ED Provider Notes (Signed)
CSN: [id number]     Arrival date & time 07/24/13  1527 History   First MD Initiated Contact with Patient 07/24/13 1622     Chief Complaint  Patient presents with  . 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  Later he felt a vague foreign body sensation in his left eye and irrigated it.  He awoke this morning with persistent foreign body sensation.  No changes in vision.  Patient is a 63 y.o. male presenting with foreign body in eye. The history is provided by the patient.  Foreign Body in Eye This is a new problem. The current episode started yesterday. The problem occurs constantly. The problem has not changed since onset.Associated symptoms comments: none. Nothing aggravates the symptoms. Nothing relieves the symptoms. Treatments tried: eye irrigation. The treatment provided no relief.    Past Medical History  Diagnosis Date  . Hyperlipidemia   . Hypertension    Past Surgical History  Procedure Laterality Date  . Hernia repair     Family History  Problem Relation Age of Onset  . Hypertension Mother   . Cancer Mother   . Cancer Father     prostate and stomach CA   History  Substance Use Topics  . Smoking status: Current Every Day Smoker -- 0.50 packs/day for 35 years  . Smokeless tobacco: Never Used  . Alcohol Use: Yes     Comment: 2 beers daily    Review of Systems  Eyes: Positive for photophobia and pain. Negative for discharge, redness, itching and visual disturbance.  All other systems reviewed and are negative.   Allergies  Review of patient's allergies indicates no known allergies.  Home Medications   Prior to Admission medications   Medication Sig Start Date End Date Taking? Authorizing Provider  aspirin 81 MG tablet Take 81 mg by mouth daily.    Historical Provider, MD  atorvastatin (LIPITOR) 20 MG tablet Take 20 mg by mouth daily.    Historical Provider, MD  losartan (COZAAR) 50 MG tablet Take 50 mg by  mouth daily.    Historical Provider, MD  meloxicam (MOBIC) 15 MG tablet ONE TAB BY MOUTH EVERY MORNING WITH BREAKFAST FOR 2 WEEKS, THEN DAILY AS NEEDED FOR PAIN.    Silverio Decamp, MD  terbinafine (LAMISIL) 250 MG tablet TAKE 1 TABLET (250 MG TOTAL) BY MOUTH DAILY.    Silverio Decamp, MD  trimethoprim-polymyxin b (POLYTRIM) ophthalmic solution Place 1 drop into the left eye every 4 (four) hours. 07/24/13   Kandra Nicolas, MD   BP 123/76  Pulse 71  Temp(Src) 98 F (36.7 C) (Oral)  Ht 6\' 4"  (1.93 m)  Wt 181 lb (82.101 kg)  BMI 22.04 kg/m2  SpO2 98% Physical Exam  Nursing note and vitals reviewed. Constitutional: He appears well-developed and well-nourished. No distress.  HENT:  Head: Normocephalic.  Eyes: Conjunctivae and EOM are normal. Pupils are equal, round, and reactive to light. Left eye exhibits no chemosis, no discharge, no exudate and no hordeolum. No foreign body present in the left eye.    Lid eversion left eye reveals no foreign body.  On the lower edge of left cornea as noted on diagram is faint fluorescein uptake ("ground glass" appearance).       ED Course  Procedures  none     MDM   1. Corneal irritation of left eye    Begin polytrim ophthalmic solution. Followup with  ophthalmologist if not improved 3 days.    Kandra Nicolas, MD 07/24/13 1650

## 2013-08-22 ENCOUNTER — Other Ambulatory Visit: Payer: Self-pay | Admitting: Sports Medicine

## 2013-10-20 ENCOUNTER — Encounter: Payer: Self-pay | Admitting: Sports Medicine

## 2013-10-20 ENCOUNTER — Ambulatory Visit (INDEPENDENT_AMBULATORY_CARE_PROVIDER_SITE_OTHER): Payer: BC Managed Care – PPO | Admitting: Sports Medicine

## 2013-10-20 VITALS — BP 129/74 | HR 68 | Ht 76.0 in | Wt 188.0 lb

## 2013-10-20 DIAGNOSIS — Z0000 Encounter for general adult medical examination without abnormal findings: Secondary | ICD-10-CM | POA: Insufficient documentation

## 2013-10-20 DIAGNOSIS — F17200 Nicotine dependence, unspecified, uncomplicated: Secondary | ICD-10-CM

## 2013-10-20 DIAGNOSIS — B351 Tinea unguium: Secondary | ICD-10-CM

## 2013-10-20 DIAGNOSIS — I10 Essential (primary) hypertension: Secondary | ICD-10-CM

## 2013-10-20 DIAGNOSIS — E785 Hyperlipidemia, unspecified: Secondary | ICD-10-CM

## 2013-10-20 DIAGNOSIS — F411 Generalized anxiety disorder: Secondary | ICD-10-CM

## 2013-10-20 DIAGNOSIS — Z23 Encounter for immunization: Secondary | ICD-10-CM

## 2013-10-20 MED ORDER — TERBINAFINE HCL 250 MG PO TABS: 250.0000 mg | ORAL_TABLET | Freq: Every day | ORAL | Status: DC

## 2013-10-20 NOTE — Assessment & Plan Note (Signed)
Colonoscopy was about 7 years ago, return for a physical. Blood work has been normal.

## 2013-10-20 NOTE — Assessment & Plan Note (Signed)
Retreat with 6 months of Lamisil.

## 2013-10-20 NOTE — Assessment & Plan Note (Signed)
Stable on losartan 

## 2013-10-20 NOTE — Assessment & Plan Note (Signed)
Stable on atorvastatin. 

## 2013-10-20 NOTE — Progress Notes (Signed)
  Subjective:    CC: Reestablish care  HPI: Hyperlipidemia: Stable on Lipitor 10  Hypertension: Stable on losartan  Onychomycosis: Desires to try an additional course of Lamisil  Generalized anxiety: Resolved.   Past medical history, Surgical history, Family history not pertinant except as noted below, Social history, Allergies, and medications have been entered into the medical record, reviewed, and no changes needed.   Review of Systems: No fevers, chills, night sweats, weight loss, chest pain, or shortness of breath.   Objective:    General: Well Developed, well nourished, and in no acute distress.  Neuro: Alert and oriented x3, extra-ocular muscles intact, sensation grossly intact.  HEENT: Normocephalic, atraumatic, pupils equal round reactive to light, neck supple, no masses, no lymphadenopathy, thyroid nonpalpable.  Skin: Warm and dry, no rashes. Cardiac: Regular rate and rhythm, no murmurs rubs or gallops, no lower extremity edema.  Respiratory: Clear to auscultation bilaterally. Not using accessory muscles, speaking in full sentences.   Impression and Recommendations:

## 2013-10-20 NOTE — Assessment & Plan Note (Signed)
Doing well, does not desire to try any medications.

## 2013-11-23 ENCOUNTER — Other Ambulatory Visit: Payer: Self-pay | Admitting: Sports Medicine

## 2013-11-23 MED ORDER — LOSARTAN POTASSIUM 50 MG PO TABS: 50.0000 mg | ORAL_TABLET | Freq: Every day | ORAL | Status: DC

## 2013-12-24 ENCOUNTER — Other Ambulatory Visit: Payer: Self-pay | Admitting: Sports Medicine

## 2014-02-02 ENCOUNTER — Encounter: Payer: Self-pay | Admitting: Sports Medicine

## 2014-02-02 ENCOUNTER — Ambulatory Visit (INDEPENDENT_AMBULATORY_CARE_PROVIDER_SITE_OTHER): Payer: BC Managed Care – PPO | Admitting: Sports Medicine

## 2014-02-02 VITALS — BP 138/74 | HR 83 | Ht 76.0 in | Wt 182.0 lb

## 2014-02-02 DIAGNOSIS — R221 Localized swelling, mass and lump, neck: Secondary | ICD-10-CM | POA: Insufficient documentation

## 2014-02-02 DIAGNOSIS — Z720 Tobacco use: Secondary | ICD-10-CM

## 2014-02-02 DIAGNOSIS — L409 Psoriasis, unspecified: Secondary | ICD-10-CM | POA: Diagnosis not present

## 2014-02-02 DIAGNOSIS — I10 Essential (primary) hypertension: Secondary | ICD-10-CM

## 2014-02-02 DIAGNOSIS — E785 Hyperlipidemia, unspecified: Secondary | ICD-10-CM

## 2014-02-02 DIAGNOSIS — Z0000 Encounter for general adult medical examination without abnormal findings: Secondary | ICD-10-CM

## 2014-02-02 DIAGNOSIS — F17200 Nicotine dependence, unspecified, uncomplicated: Secondary | ICD-10-CM

## 2014-02-02 LAB — HEMOCCULT GUIAC POC 1CARD (OFFICE): Fecal Occult Blood, POC: NEGATIVE

## 2014-02-02 MED ORDER — TACROLIMUS 0.1 % EX OINT: TOPICAL_OINTMENT | Freq: Two times a day (BID) | CUTANEOUS | Status: DC

## 2014-02-02 MED ORDER — MOMETASONE FUROATE 0.1 % EX CREA: 1.0000 "application " | TOPICAL_CREAM | Freq: Every day | CUTANEOUS | Status: DC

## 2014-02-02 NOTE — Progress Notes (Signed)
  Subjective:    CC: Complete physical  HPI:  Hypertension: Improved on recheck.  Right foot pain: After metatarsal fractures, continues to have some on discomfort over the dorsum of his fourth metatarsal particularly with driving, he does not desire any further workup.  Psoriasis: Has used tacrolimus cream in the past and desires a refill, he does not desire to see dermatologist. He also has some mometasone cream that he is used for some seborrheic dermatitis and would like a refill.  Smoker: Not yet ready to quit.  Past medical history, Surgical history, Family history not pertinant except as noted below, Social history, Allergies, and medications have been entered into the medical record, reviewed, and no changes needed.   Review of Systems: No headache, visual changes, nausea, vomiting, diarrhea, constipation, dizziness, abdominal pain, skin rash, fevers, chills, night sweats, swollen lymph nodes, weight loss, chest pain, body aches, joint swelling, muscle aches, shortness of breath, mood changes, visual or auditory hallucinations.  Objective:    General: Well Developed, well nourished, and in no acute distress.  Neuro: Alert and oriented x3, extra-ocular muscles intact, sensation grossly intact.  HEENT: Normocephalic, atraumatic, pupils equal round reactive to light, neck supple, there is a 1 cm round, firm, movable mass in the left side of the neck at the angle of the mandible, no lymphadenopathy, thyroid nonpalpable. Oropharynx, nasopharynx, ear canals are unremarkable. Skin: Warm and dry, no rashes noted.  Cardiac: Regular rate and rhythm, no murmurs rubs or gallops.  Respiratory: Clear to auscultation bilaterally. Not using accessory muscles, speaking in full sentences.  Abdominal: Soft, nontender, nondistended, positive bowel sounds, no masses, no organomegaly.  Musculoskeletal: Shoulder, elbow, wrist, hip, knee, ankle stable, and with full range of motion. Rectal: Good tone,  normal sized and smooth prostate, Hemoccult negative.  Impression and Recommendations:    The patient was counselled, risk factors were discussed, anticipatory guidance given.

## 2014-02-02 NOTE — Assessment & Plan Note (Signed)
CT neck with contrast considering history of smoking.

## 2014-02-02 NOTE — Assessment & Plan Note (Addendum)
Tacrolimus and mometasone per patient request. Can certainly add topical vitamin D or coal tar if persistent symptoms.

## 2014-02-02 NOTE — Assessment & Plan Note (Signed)
Blood pressure has improved on recheck, no changes.

## 2014-02-02 NOTE — Assessment & Plan Note (Signed)
Discussed strategies to quit. Patient is pre-contemplative.

## 2014-02-02 NOTE — Assessment & Plan Note (Signed)
Physical exam as above. Checking some blood work.

## 2014-02-14 LAB — COMPREHENSIVE METABOLIC PANEL WITH GFR
ALT: 12 U/L (ref 0–53)
Albumin: 4.1 g/dL (ref 3.5–5.2)
CO2: 22 meq/L (ref 19–32)
Calcium: 9.4 mg/dL (ref 8.4–10.5)
Chloride: 107 meq/L (ref 96–112)
Glucose, Bld: 87 mg/dL (ref 70–99)
Sodium: 137 meq/L (ref 135–145)
Total Bilirubin: 0.6 mg/dL (ref 0.2–1.2)
Total Protein: 6.7 g/dL (ref 6.0–8.3)

## 2014-02-14 LAB — LIPID PANEL
Cholesterol: 191 mg/dL (ref 0–200)
HDL: 73 mg/dL (ref >39)
LDL Cholesterol: 108 mg/dL — ABNORMAL HIGH (ref 0–99)
Total CHOL/HDL Ratio: 2.6 Ratio
Triglycerides: 48 mg/dL (ref <150)
VLDL: 10 mg/dL (ref 0–40)

## 2014-02-14 LAB — PSA, TOTAL AND FREE
PSA, Free Pct: 16 % — ABNORMAL LOW (ref >25)
PSA, Free: 0.17 ng/mL
PSA: 1.06 ng/mL (ref <=4.00)

## 2014-02-14 LAB — COMPREHENSIVE METABOLIC PANEL
AST: 18 U/L (ref 0–37)
Alkaline Phosphatase: 73 U/L (ref 39–117)
BUN: 19 mg/dL (ref 6–23)
Creat: 0.88 mg/dL (ref 0.50–1.35)
Potassium: 4.1 mEq/L (ref 3.5–5.3)

## 2014-02-14 LAB — CBC
HCT: 40.3 % (ref 39.0–52.0)
Hemoglobin: 13.9 g/dL (ref 13.0–17.0)
MCH: 31.8 pg (ref 26.0–34.0)
MCHC: 34.5 g/dL (ref 30.0–36.0)
MCV: 92.2 fL (ref 78.0–100.0)
MPV: 8.8 fL — ABNORMAL LOW (ref 9.4–12.4)
Platelets: 270 10*3/uL (ref 150–400)
RBC: 4.37 MIL/uL (ref 4.22–5.81)
RDW: 13.7 % (ref 11.5–15.5)
WBC: 5.4 10*3/uL (ref 4.0–10.5)

## 2014-02-14 LAB — HEMOGLOBIN A1C
Hgb A1c MFr Bld: 5.6 % (ref <5.7)
Mean Plasma Glucose: 114 mg/dL (ref <117)

## 2014-02-14 LAB — TSH: TSH: 2.084 u[IU]/mL (ref 0.350–4.500)

## 2014-03-02 ENCOUNTER — Ambulatory Visit (INDEPENDENT_AMBULATORY_CARE_PROVIDER_SITE_OTHER): Payer: BLUE CROSS/BLUE SHIELD

## 2014-03-02 ENCOUNTER — Encounter: Payer: Self-pay | Admitting: Sports Medicine

## 2014-03-02 ENCOUNTER — Ambulatory Visit (INDEPENDENT_AMBULATORY_CARE_PROVIDER_SITE_OTHER): Payer: BLUE CROSS/BLUE SHIELD | Admitting: Sports Medicine

## 2014-03-02 VITALS — BP 135/77 | HR 74 | Ht 76.0 in | Wt 186.0 lb

## 2014-03-02 DIAGNOSIS — R221 Localized swelling, mass and lump, neck: Secondary | ICD-10-CM | POA: Diagnosis not present

## 2014-03-02 DIAGNOSIS — I10 Essential (primary) hypertension: Secondary | ICD-10-CM

## 2014-03-02 DIAGNOSIS — F17200 Nicotine dependence, unspecified, uncomplicated: Secondary | ICD-10-CM

## 2014-03-02 DIAGNOSIS — E785 Hyperlipidemia, unspecified: Secondary | ICD-10-CM | POA: Diagnosis not present

## 2014-03-02 DIAGNOSIS — L409 Psoriasis, unspecified: Secondary | ICD-10-CM

## 2014-03-02 MED ORDER — ATORVASTATIN CALCIUM 20 MG PO TABS: 20.0000 mg | ORAL_TABLET | Freq: Every day | ORAL | Status: DC

## 2014-03-02 MED ORDER — IOHEXOL 300 MG/ML  SOLN
75.0000 mL | Freq: Once | INTRAMUSCULAR | Status: AC | PRN
  Administered 2014-03-02: 75 mL via INTRAVENOUS

## 2014-03-02 NOTE — Progress Notes (Signed)
  Subjective:    CC: Follow-up  HPI: Psoriasis: Was unable to afford tacrolimus cream, has already used topical vitamin D without any improvement.  PUVA has been effective. Would like a referral to dermatology.  Neck nodule: Has not yet had his CT scan.  Hyperlipidemia: Stable, needs a refill on his Lipitor.  Past medical history, Surgical history, Family history not pertinant except as noted below, Social history, Allergies, and medications have been entered into the medical record, reviewed, and no changes needed.   Review of Systems: No fevers, chills, night sweats, weight loss, chest pain, or shortness of breath.   Objective:    General: Well Developed, well nourished, and in no acute distress.  Neuro: Alert and oriented x3, extra-ocular muscles intact, sensation grossly intact.  HEENT: Normocephalic, atraumatic, pupils equal round reactive to light, neck supple, no masses, no lymphadenopathy, thyroid nonpalpable.  Skin: Warm and dry, no rashes. Cardiac: Regular rate and rhythm, no murmurs rubs or gallops, no lower extremity edema.  Respiratory: Clear to auscultation bilaterally. Not using accessory muscles, speaking in full sentences.  CT scan with IV contrast is negative, there is no residual nodule.  Impression and Recommendations:

## 2014-03-02 NOTE — Assessment & Plan Note (Signed)
Referral to dermatology.  

## 2014-03-02 NOTE — Assessment & Plan Note (Signed)
Well-controlled, refilling Lipitor.

## 2014-03-02 NOTE — Assessment & Plan Note (Signed)
Controlled, no changes, renal function is adequate.

## 2014-03-02 NOTE — Assessment & Plan Note (Signed)
CT scan will be done today.

## 2014-03-06 ENCOUNTER — Telehealth: Payer: Self-pay | Admitting: Sports Medicine

## 2014-03-06 NOTE — Telephone Encounter (Signed)
I called Keith Moses to give him his Derm appt. At the time he let me know that his script for Lipitor should be 10mg  and not 20mg 

## 2014-04-19 ENCOUNTER — Other Ambulatory Visit: Payer: Self-pay | Admitting: Sports Medicine

## 2014-04-29 ENCOUNTER — Other Ambulatory Visit: Payer: Self-pay | Admitting: Sports Medicine

## 2014-10-09 ENCOUNTER — Encounter: Payer: Self-pay | Admitting: Family Medicine

## 2014-10-09 ENCOUNTER — Ambulatory Visit (INDEPENDENT_AMBULATORY_CARE_PROVIDER_SITE_OTHER): Payer: BLUE CROSS/BLUE SHIELD | Admitting: Family Medicine

## 2014-10-09 VITALS — BP 134/78 | HR 80 | Wt 183.0 lb

## 2014-10-09 DIAGNOSIS — M545 Low back pain, unspecified: Secondary | ICD-10-CM

## 2014-10-09 DIAGNOSIS — R1904 Left lower quadrant abdominal swelling, mass and lump: Secondary | ICD-10-CM | POA: Diagnosis not present

## 2014-10-09 DIAGNOSIS — J0110 Acute frontal sinusitis, unspecified: Secondary | ICD-10-CM

## 2014-10-09 DIAGNOSIS — M549 Dorsalgia, unspecified: Secondary | ICD-10-CM | POA: Insufficient documentation

## 2014-10-09 DIAGNOSIS — J329 Chronic sinusitis, unspecified: Secondary | ICD-10-CM | POA: Insufficient documentation

## 2014-10-09 MED ORDER — IPRATROPIUM BROMIDE 0.06 % NA SOLN: 2.0000 | Freq: Four times a day (QID) | NASAL | Status: DC

## 2014-10-09 MED ORDER — AZITHROMYCIN 250 MG PO TABS: 250.0000 mg | ORAL_TABLET | Freq: Every day | ORAL | Status: DC

## 2014-10-09 MED ORDER — MELOXICAM 15 MG PO TABS: 15.0000 mg | ORAL_TABLET | Freq: Every day | ORAL | Status: DC

## 2014-10-09 MED ORDER — PREDNISONE 10 MG PO TABS: 30.0000 mg | ORAL_TABLET | Freq: Every day | ORAL | Status: DC

## 2014-10-09 NOTE — Progress Notes (Signed)
Keith Moses is a 64 y.o. male who presents to [facility]  today for sinus pain and pressure associated with ear crackling cough and mild wheezing. Symptoms present for 2 days. No fevers chills nausea vomiting or diarrhea. Additionally he notes persistent low back pain and also a mass in the left lower quadrant of his abdomen. Back pain is mild and well-controlled with meloxicam. He would like a refill. No change in pain. No radiating pain weakness or numbness. He notes multiple years of a persistent mass in the left lower abdominal wall that is mildly tender. It sometimes gets bigger and smaller. He notes that it is not painful currently.   Past Medical History  Diagnosis Date  . Hyperlipidemia   . Hypertension    Past Surgical History  Procedure Laterality Date  . Hernia repair     Social History  Substance Use Topics  . Smoking status: Current Every Day Smoker -- 0.50 packs/day for 35 years  . Smokeless tobacco: Never Used  . Alcohol Use: Yes     Comment: 2 beers daily   ROS as above Medications: Current Outpatient Prescriptions  Medication Sig Dispense Refill  . aspirin 81 MG tablet Take 81 mg by mouth daily.    Marland Kitchen atorvastatin (LIPITOR) 20 MG tablet Take 1 tablet (20 mg total) by mouth daily. 90 tablet 3  . losartan (COZAAR) 50 MG tablet Take 1 tablet (50 mg total) by mouth daily. 90 tablet 3  . meloxicam (MOBIC) 15 MG tablet Take 1 tablet (15 mg total) by mouth daily. 30 tablet 1  . mometasone (ELOCON) 0.1 % cream Apply 1 application topically daily. 45 g 0  . tacrolimus (PROTOPIC) 0.1 % ointment Apply topically 2 (two) times daily. 100 g 0  . terbinafine (LAMISIL) 250 MG tablet Take 1 tablet (250 mg total) by mouth daily. 90 tablet 1  . azithromycin (ZITHROMAX) 250 MG tablet Take 1 tablet (250 mg total) by mouth daily. Take first 2 tablets together, then 1 every day until finished. 6 tablet 0  . ipratropium (ATROVENT) 0.06 % nasal spray Place  2 sprays into both nostrils 4 (four) times daily. 15 mL 1  . predniSONE (DELTASONE) 10 MG tablet Take 3 tablets (30 mg total) by mouth daily. 15 tablet 0   No current facility-administered medications for this visit.   No Known Allergies   Exam:  BP 134/78 mmHg  Pulse 80  Wt 183 lb (83.008 kg) Gen: Well NAD HEENT: EOMI,  MMM clear nasal discharge. Posterior pharynx with cobblestoning. Normal tympanic membranes bilaterally. Lungs: Normal work of breathing. CTABL Heart: RRR no MRG Abd: NABS, Soft. Nondistended, Nontender small nontender not particularly reducible spigelian hernia left Exts: Brisk capillary refill, warm and well perfused.  Back: Nontender normal motion normal.   No results found for this or any previous visit (from the past 24 hour(s)). No results found.   Please see individual assessment and plan sections.

## 2014-10-09 NOTE — Assessment & Plan Note (Signed)
Refill meloxicam follow-up with PCP

## 2014-10-09 NOTE — Assessment & Plan Note (Signed)
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 Recommend patient return to his general surgeon. In about possibility of incarceration

## 2014-10-09 NOTE — Assessment & Plan Note (Signed)
Likely viral. Treat with prednisone Atrovent nasal spray azithromycin. Azithromycin for use if not better

## 2014-10-09 NOTE — Patient Instructions (Signed)
Thank you for coming in today. Take prednisone and Atrovent nasal spray for sinusitis. Use azithromycin if not better. Use meloxicam daily as needed for back pain. I will strongly recommend he follow-up with the general surgeon. I believe you have a spigelian hernia which is at high risk for incarceration. Please quit smoking  Sinusitis Sinusitis is redness, soreness, and inflammation of the paranasal sinuses. Paranasal sinuses are air pockets within the bones of your face (beneath the eyes, the middle of the forehead, or above the eyes). In healthy paranasal sinuses, mucus is able to drain out, and air is able to circulate through them by way of your nose. However, when your paranasal sinuses are inflamed, mucus and air can become trapped. This can allow bacteria and other germs to grow and cause infection. Sinusitis can develop quickly and last only a short time (acute) or continue over a long period (chronic). Sinusitis that lasts for more than 12 weeks is considered chronic.  CAUSES  Causes of sinusitis include:  Allergies.  Structural abnormalities, such as displacement of the cartilage that separates your nostrils (deviated septum), which can decrease the air flow through your nose and sinuses and affect sinus drainage.  Functional abnormalities, such as when the small hairs (cilia) that line your sinuses and help remove mucus do not work properly or are not present. SIGNS AND SYMPTOMS  Symptoms of acute and chronic sinusitis are the same. The primary symptoms are pain and pressure around the affected sinuses. Other symptoms include:  Upper toothache.  Earache.  Headache.  Bad breath.  Decreased sense of smell and taste.  A cough, which worsens when you are lying flat.  Fatigue.  Fever.  Thick drainage from your nose, which often is green and may contain pus (purulent).  Swelling and warmth over the affected sinuses. DIAGNOSIS  Your health care provider will perform a  physical exam. During the exam, your health care provider may:  Look in your nose for signs of abnormal growths in your nostrils (nasal polyps).  Tap over the affected sinus to check for signs of infection.  View the inside of your sinuses (endoscopy) using an imaging device that has a light attached (endoscope). If your health care provider suspects that you have chronic sinusitis, one or more of the following tests may be recommended:  Allergy tests.  Nasal culture. A sample of mucus is taken from your nose, sent to a lab, and screened for bacteria.  Nasal cytology. A sample of mucus is taken from your nose and examined by your health care provider to determine if your sinusitis is related to an allergy. TREATMENT  Most cases of acute sinusitis are related to a viral infection and will resolve on their own within 10 days. Sometimes medicines are prescribed to help relieve symptoms (pain medicine, decongestants, nasal steroid sprays, or saline sprays).  However, for sinusitis related to a bacterial infection, your health care provider will prescribe antibiotic medicines. These are medicines that will help kill the bacteria causing the infection.  Rarely, sinusitis is caused by a fungal infection. In theses cases, your health care provider will prescribe antifungal medicine. For some cases of chronic sinusitis, surgery is needed. Generally, these are cases in which sinusitis recurs more than 3 times per year, despite other treatments. HOME CARE INSTRUCTIONS   Drink plenty of water. Water helps thin the mucus so your sinuses can drain more easily.  Use a humidifier.  Inhale steam 3 to 4 times a day (for example,  sit in the bathroom with the shower running).  Apply a warm, moist washcloth to your face 3 to 4 times a day, or as directed by your health care provider.  Use saline nasal sprays to help moisten and clean your sinuses.  Take medicines only as directed by your health care  provider.  If you were prescribed either an antibiotic or antifungal medicine, finish it all even if you start to feel better. SEEK IMMEDIATE MEDICAL CARE IF:  You have increasing pain or severe headaches.  You have nausea, vomiting, or drowsiness.  You have swelling around your face.  You have vision problems.  You have a stiff neck.  You have difficulty breathing. MAKE SURE YOU:   Understand these instructions.  Will watch your condition.  Will get help right away if you are not doing well or get worse. Document Released: 02/09/2005 Document Revised: 06/26/2013 Document Reviewed: 02/24/2011 [facility] Patient Information 2015 [place], [place]. This information is not intended to replace advice given to you by your health care provider. Make sure you discuss any questions you have with your health care provider.

## 2014-11-24 ENCOUNTER — Other Ambulatory Visit: Payer: Self-pay | Admitting: Sports Medicine

## 2015-06-01 ENCOUNTER — Other Ambulatory Visit: Payer: Self-pay | Admitting: Sports Medicine

## 2015-10-07 ENCOUNTER — Ambulatory Visit (INDEPENDENT_AMBULATORY_CARE_PROVIDER_SITE_OTHER): Payer: BLUE CROSS/BLUE SHIELD | Admitting: Sports Medicine

## 2015-10-07 DIAGNOSIS — F411 Generalized anxiety disorder: Secondary | ICD-10-CM

## 2015-10-07 DIAGNOSIS — M545 Low back pain, unspecified: Secondary | ICD-10-CM

## 2015-10-07 DIAGNOSIS — B351 Tinea unguium: Secondary | ICD-10-CM

## 2015-10-07 DIAGNOSIS — L409 Psoriasis, unspecified: Secondary | ICD-10-CM

## 2015-10-07 DIAGNOSIS — E785 Hyperlipidemia, unspecified: Secondary | ICD-10-CM

## 2015-10-07 DIAGNOSIS — M19071 Primary osteoarthritis, right ankle and foot: Secondary | ICD-10-CM

## 2015-10-07 DIAGNOSIS — Z0000 Encounter for general adult medical examination without abnormal findings: Secondary | ICD-10-CM

## 2015-10-07 DIAGNOSIS — I10 Essential (primary) hypertension: Secondary | ICD-10-CM

## 2015-10-07 MED ORDER — MOMETASONE FUROATE 0.1 % EX CREA: 1.0000 "application " | TOPICAL_CREAM | Freq: Every day | CUTANEOUS | 11 refills | Status: DC

## 2015-10-07 MED ORDER — VALSARTAN 160 MG PO TABS: 160.0000 mg | ORAL_TABLET | Freq: Every day | ORAL | 3 refills | Status: DC

## 2015-10-07 MED ORDER — SERTRALINE HCL 25 MG PO TABS: 25.0000 mg | ORAL_TABLET | Freq: Every day | ORAL | 2 refills | Status: DC

## 2015-10-07 MED ORDER — TERBINAFINE HCL 250 MG PO TABS: 250.0000 mg | ORAL_TABLET | Freq: Every day | ORAL | 1 refills | Status: DC

## 2015-10-07 MED ORDER — MELOXICAM 15 MG PO TABS: 15.0000 mg | ORAL_TABLET | Freq: Every day | ORAL | 3 refills | Status: DC

## 2015-10-07 NOTE — Assessment & Plan Note (Signed)
Patient will return in about 2 months for physical

## 2015-10-07 NOTE — Assessment & Plan Note (Signed)
Continue meloxicam

## 2015-10-07 NOTE — Assessment & Plan Note (Signed)
Restarting terbinafine.

## 2015-10-07 NOTE — Progress Notes (Signed)
  Subjective:    CC: anxiety   HPI: 65 yo with history of HTN, dyslipidemia, GAD presenting for evaluation of anxiety.  He was recently seen in the ED for sudden-onset chest tightness and discomfort that radiated to his neck.  Troponin was negative at that time with no EKG changes.  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  He frequently wakes up in the middle of the night and ruminates on work and has trouble sleeping.  He also has back pain that is relieved with meloxicam.  He also complains of R foot pain, previously he was diagnosed with R foot OA.  Pain is worse when driving.  He cannot think of anything that makes the pain worse or better.  He also complains of fungus on his nailbeds that he has had in the past and is returning.  He also mentions that he discontinued Lipitor two weeks ago because his wife has seen commercials that advise against using Lipitor.   Past medical history, Surgical history, Family history not pertinant except as noted below, Social history, Allergies, and medications have been entered into the medical record, reviewed, and no changes needed.   Review of Systems: No fevers, chills, night sweats, weight loss, chest pain, or shortness of breath.   Objective:    General: Well Developed, well nourished, and in no acute distress.  Neuro: Alert and oriented x3, extra-ocular muscles intact, sensation grossly intact.  HEENT: Normocephalic, atraumatic, pupils equal round reactive to light, neck supple, no masses, no lymphadenopathy, thyroid nonpalpable.  Skin: Warm and dry.  Psoriasis and yellow pitting on nailbeds of bilateral hands.  Cardiac: Regular rate and rhythm, no murmurs rubs or gallops, no lower  extremity edema.  Respiratory: Clear to auscultation bilaterally. Not using accessory muscles, speaking in full sentences.   Impression and Recommendations:    1. HTN -Well-controlled today. -Will switch to Valsartan (previously on Losartan)   2. Hyperlipidemia -Has not taken atorvastatin over past two weeks. -Will restart Lipitor today. -Lipid panel and CPE in 2 months.   3. GAD: GAD-7 score of 11, PHQ-9 score of 6 today.  Is interested in starting medication -Will start Sertraline 25mg  today -Return in 1 month for f/u and repeat GAD-7/PHQ-9   4. Nail fungus -Will re-start terbinafine   5. Psoriasis -Refill mometasone cream.   6. R foot OA -Restart Meloxicam daily. If pain persists, can consider tarsal injection.  -Orthotics appointment in future

## 2015-10-07 NOTE — Assessment & Plan Note (Signed)
Adding mometasone cream per patient request. He has seen a dermatologist.

## 2015-10-07 NOTE — Assessment & Plan Note (Signed)
Starting sertraline 25, return for GAD7 in one month.

## 2015-10-07 NOTE — Assessment & Plan Note (Signed)
Restarting atorvastatin, recheck lipids in 2 months.

## 2015-10-07 NOTE — Assessment & Plan Note (Signed)
Meloxicam, return for new set of custom orthotics. Return, and we can consider and intertarsal joint injection if insufficient relief.

## 2015-10-07 NOTE — Assessment & Plan Note (Signed)
Switching to valsartan 160

## 2015-10-07 NOTE — Progress Notes (Deleted)
  Subjective:    CC: anxiety   HPI: 65 yo with history of HTN, dyslipidemia, GAD presenting for evaluation of anxiety.  He was recently seen in the ED for sudden-onset chest tightness and discomfort that radiated to his neck.  Troponin was negative at that time with no EKG changes.  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  He frequently wakes up in the middle of the night and ruminates on work and has trouble sleeping.  He also has back pain that is relieved with meloxicam.  He also complains of R foot pain, previously he was diagnosed with R foot OA.  Pain is worse when driving.  He cannot think of anything that makes the pain worse or better.  He also complains of fungus on his nailbeds that he has had in the past and is returning.  He also mentions that he discontinued Lipitor two weeks ago because his wife has seen commercials that advise against using Lipitor.   Past medical history, Surgical history, Family history not pertinant except as noted below, Social history, Allergies, and medications have been entered into the medical record, reviewed, and no changes needed.   Review of Systems: No fevers, chills, night sweats, weight loss, chest pain, or shortness of breath.   Objective:    General: Well Developed, well nourished, and in no acute distress.  Neuro: Alert and oriented x3, extra-ocular muscles intact, sensation grossly intact.  HEENT: Normocephalic, atraumatic, pupils equal round reactive to light, neck supple, no masses, no lymphadenopathy, thyroid nonpalpable.  Skin: Warm and dry.  Psoriasis and yellow pitting on nailbeds of bilateral hands.  Cardiac: Regular rate and rhythm, no murmurs rubs or gallops, no lower  extremity edema.  Respiratory: Clear to auscultation bilaterally. Not using accessory muscles, speaking in full sentences.   Impression and Recommendations:    1. HTN -Well-controlled today. -Will switch to Valsartan (previously on Losartan)   2. Hyperlipidemia -Has not taken atorvastatin over past two weeks. -Will restart Lipitor today. -Lipid panel and CPE in 2 months.   3. GAD: GAD-7 score of 11, PHQ-9 score of 6 today.  Is interested in starting medication -Will start Sertraline 25mg  today -Return in 1 month for f/u and repeat GAD-7/PHQ-9   4. Nail fungus -Will re-start terbinafine   5. Psoriasis -Refill mometasone cream.   6. R foot OA -Restart Meloxicam daily. If pain persists, can consider tarsal injection.  -Orthotics appointment in future

## 2015-11-08 ENCOUNTER — Ambulatory Visit (INDEPENDENT_AMBULATORY_CARE_PROVIDER_SITE_OTHER): Payer: BLUE CROSS/BLUE SHIELD | Admitting: Sports Medicine

## 2015-11-08 ENCOUNTER — Encounter: Payer: Self-pay | Admitting: Sports Medicine

## 2015-11-08 DIAGNOSIS — F411 Generalized anxiety disorder: Secondary | ICD-10-CM

## 2015-11-08 DIAGNOSIS — Z0000 Encounter for general adult medical examination without abnormal findings: Secondary | ICD-10-CM

## 2015-11-08 DIAGNOSIS — E785 Hyperlipidemia, unspecified: Secondary | ICD-10-CM | POA: Diagnosis not present

## 2015-11-08 DIAGNOSIS — L409 Psoriasis, unspecified: Secondary | ICD-10-CM | POA: Diagnosis not present

## 2015-11-08 MED ORDER — CALCIPOTRIENE-BETAMETH DIPROP 0.005-0.064 % EX OINT: TOPICAL_OINTMENT | Freq: Every day | CUTANEOUS | 11 refills | Status: DC

## 2015-11-08 NOTE — Progress Notes (Signed)
  Subjective:    CC: Follow-up  HPI: Generalized anxiety: Fantastic response to 25 mg of sertraline, has essentially 0 anxiety, happy with how things are going.  Past medical history, Surgical history, Family history not pertinant except as noted below, Social history, Allergies, and medications have been entered into the medical record, reviewed, and no changes needed.   Review of Systems: No fevers, chills, night sweats, weight loss, chest pain, or shortness of breath.   Objective:    General: Well Developed, well nourished, and in no acute distress.  Neuro: Alert and oriented x3, extra-ocular muscles intact, sensation grossly intact.  HEENT: Normocephalic, atraumatic, pupils equal round reactive to light, neck supple, no masses, no lymphadenopathy, thyroid nonpalpable.  Skin: Warm and dry, no rashes. Cardiac: Regular rate and rhythm, no murmurs rubs or gallops, no lower extremity edema.  Respiratory: Clear to auscultation bilaterally. Not using accessory muscles, speaking in full sentences.  Impression and Recommendations:    Generalized anxiety disorder Fantastic response to sertraline 25, no changes.

## 2015-11-08 NOTE — Assessment & Plan Note (Signed)
Fantastic response to sertraline 25, no changes.

## 2015-11-26 ENCOUNTER — Other Ambulatory Visit: Payer: Self-pay | Admitting: Sports Medicine

## 2016-01-09 ENCOUNTER — Other Ambulatory Visit: Payer: Self-pay | Admitting: Sports Medicine

## 2016-01-09 DIAGNOSIS — F411 Generalized anxiety disorder: Secondary | ICD-10-CM

## 2016-01-09 LAB — CBC
HCT: 44.4 % (ref 38.5–50.0)
Hemoglobin: 15.5 g/dL (ref 13.2–17.1)
MCH: 32.7 pg (ref 27.0–33.0)
MCHC: 34.9 g/dL (ref 32.0–36.0)
MCV: 93.7 fL (ref 80.0–100.0)
MPV: 8.7 fL (ref 7.5–12.5)
Platelets: 244 K/uL (ref 140–400)
RBC: 4.74 MIL/uL (ref 4.20–5.80)
RDW: 13.8 % (ref 11.0–15.0)
WBC: 6.4 K/uL (ref 3.8–10.8)

## 2016-01-10 ENCOUNTER — Encounter: Payer: Self-pay | Admitting: Sports Medicine

## 2016-01-10 ENCOUNTER — Ambulatory Visit (INDEPENDENT_AMBULATORY_CARE_PROVIDER_SITE_OTHER): Payer: BLUE CROSS/BLUE SHIELD | Admitting: Sports Medicine

## 2016-01-10 ENCOUNTER — Other Ambulatory Visit: Payer: Self-pay | Admitting: Sports Medicine

## 2016-01-10 VITALS — BP 121/73 | HR 85 | Resp 18 | Wt 186.0 lb

## 2016-01-10 DIAGNOSIS — Z23 Encounter for immunization: Secondary | ICD-10-CM | POA: Diagnosis not present

## 2016-01-10 DIAGNOSIS — Z0000 Encounter for general adult medical examination without abnormal findings: Secondary | ICD-10-CM

## 2016-01-10 DIAGNOSIS — M19071 Primary osteoarthritis, right ankle and foot: Secondary | ICD-10-CM

## 2016-01-10 DIAGNOSIS — F17200 Nicotine dependence, unspecified, uncomplicated: Secondary | ICD-10-CM | POA: Diagnosis not present

## 2016-01-10 LAB — COMPREHENSIVE METABOLIC PANEL
ALT: 10 U/L (ref 9–46)
AST: 19 U/L (ref 10–35)
Alkaline Phosphatase: 57 U/L (ref 40–115)
BUN: 20 mg/dL (ref 7–25)
Calcium: 9.2 mg/dL (ref 8.6–10.3)
Glucose, Bld: 84 mg/dL (ref 65–99)
Total Bilirubin: 0.7 mg/dL (ref 0.2–1.2)

## 2016-01-10 LAB — COMPREHENSIVE METABOLIC PANEL WITH GFR
Albumin: 4 g/dL (ref 3.6–5.1)
CO2: 22 mmol/L (ref 20–31)
Chloride: 107 mmol/L (ref 98–110)
Creat: 0.92 mg/dL (ref 0.70–1.25)
Potassium: 4 mmol/L (ref 3.5–5.3)
Sodium: 139 mmol/L (ref 135–146)
Total Protein: 6.7 g/dL (ref 6.1–8.1)

## 2016-01-10 LAB — PSA, TOTAL AND FREE
PSA, % Free: 17 % — ABNORMAL LOW (ref >25)
PSA, Free: 0.2 ng/mL
PSA, Total: 1.2 ng/mL (ref <=4.0)

## 2016-01-10 LAB — LIPID PANEL
Cholesterol: 202 mg/dL — ABNORMAL HIGH (ref <200)
HDL: 88 mg/dL (ref >40)
LDL Cholesterol: 104 mg/dL — ABNORMAL HIGH (ref <100)
Total CHOL/HDL Ratio: 2.3 Ratio (ref <5.0)
Triglycerides: 49 mg/dL (ref <150)
VLDL: 10 mg/dL (ref <30)

## 2016-01-10 LAB — HEMOGLOBIN A1C
Hgb A1c MFr Bld: 5.2 % (ref <5.7)
Mean Plasma Glucose: 103 mg/dL

## 2016-01-10 LAB — VITAMIN D 25 HYDROXY (VIT D DEFICIENCY, FRACTURES): Vit D, 25-Hydroxy: 25 ng/mL — ABNORMAL LOW (ref 30–100)

## 2016-01-10 LAB — TSH: TSH: 2.38 m[IU]/L (ref 0.40–4.50)

## 2016-01-10 MED ORDER — CELECOXIB 200 MG PO CAPS: ORAL_CAPSULE | ORAL | 2 refills | Status: DC

## 2016-01-10 MED ORDER — VITAMIN D (ERGOCALCIFEROL) 1.25 MG (50000 UNIT) PO CAPS: 50000.0000 [IU] | ORAL_CAPSULE | ORAL | 0 refills | Status: DC

## 2016-01-10 NOTE — Assessment & Plan Note (Signed)
With widespread osteoarthritis, switching from meloxicam to Celebrex. If no improvement in 2 weeks we will consider and intertarsal joint injection.

## 2016-01-10 NOTE — Assessment & Plan Note (Signed)
Not yet ready to quit, he has a greater than 50-pack-year history, ordering screening CT of the chest.

## 2016-01-10 NOTE — Progress Notes (Signed)
  Subjective:    CC: Annual physical exam  HPI:  This is a pleasant 65 year old male here for his physical. His only complaint is widespread pain, he does have a diagnosis of generalized osteoarthritis. Pain is predominantly in the thumbs as well as his right foot. Moderate, persistent without radiation.  Past medical history:  Negative.  See flowsheet/record as well for more information.  Surgical history: Negative.  See flowsheet/record as well for more information.  Family history: Negative.  See flowsheet/record as well for more information.  Social history: Negative.  See flowsheet/record as well for more information.  Allergies, and medications have been entered into the medical record, reviewed, and no changes needed.    Review of Systems: No headache, visual changes, nausea, vomiting, diarrhea, constipation, dizziness, abdominal pain, skin rash, fevers, chills, night sweats, swollen lymph nodes, weight loss, chest pain, body aches, joint swelling, muscle aches, shortness of breath, mood changes, visual or auditory hallucinations.  Objective:    General: Well Developed, well nourished, and in no acute distress.  Neuro: Alert and oriented x3, extra-ocular muscles intact, sensation grossly intact. Cranial nerves II through XII are intact, motor, sensory, and coordinative functions are all intact. HEENT: Normocephalic, atraumatic, pupils equal round reactive to light, neck supple, no masses, no lymphadenopathy, thyroid nonpalpable. Oropharynx, nasopharynx, external ear canals are unremarkable. Skin: Warm and dry, no rashes noted.  Cardiac: Regular rate and rhythm, no murmurs rubs or gallops.  Respiratory: Clear to auscultation bilaterally. Not using accessory muscles, speaking in full sentences.  Abdominal: Soft, nontender, nondistended, positive bowel sounds, no masses, no organomegaly.  Musculoskeletal: Shoulder, elbow, wrist, hip, knee, ankle stable, and with full range of  motion.  Impression and Recommendations:    The patient was counselled, risk factors were discussed, anticipatory guidance given.  Annual physical exam Annual physical as above. We went over his blood work which was normal.  Smoker Not yet ready to quit, he has a greater than 50-pack-year history, ordering screening CT of the chest.  Primary osteoarthritis of right foot With widespread osteoarthritis, switching from meloxicam to Celebrex. If no improvement in 2 weeks we will consider and intertarsal joint injection.

## 2016-01-10 NOTE — Assessment & Plan Note (Signed)
Annual physical as above. We went over his blood work which was normal.

## 2016-01-21 ENCOUNTER — Ambulatory Visit (INDEPENDENT_AMBULATORY_CARE_PROVIDER_SITE_OTHER): Payer: BLUE CROSS/BLUE SHIELD

## 2016-01-21 DIAGNOSIS — I700 Atherosclerosis of aorta: Secondary | ICD-10-CM | POA: Diagnosis not present

## 2016-01-21 DIAGNOSIS — F17200 Nicotine dependence, unspecified, uncomplicated: Secondary | ICD-10-CM | POA: Diagnosis not present

## 2016-01-21 DIAGNOSIS — I2510 Atherosclerotic heart disease of native coronary artery without angina pectoris: Secondary | ICD-10-CM | POA: Diagnosis not present

## 2016-01-21 DIAGNOSIS — J439 Emphysema, unspecified: Secondary | ICD-10-CM | POA: Diagnosis not present

## 2016-02-05 ENCOUNTER — Other Ambulatory Visit: Payer: Self-pay | Admitting: Sports Medicine

## 2016-02-05 DIAGNOSIS — I10 Essential (primary) hypertension: Secondary | ICD-10-CM

## 2016-03-05 ENCOUNTER — Other Ambulatory Visit: Payer: Self-pay | Admitting: Sports Medicine

## 2016-04-03 ENCOUNTER — Other Ambulatory Visit: Payer: Self-pay | Admitting: Sports Medicine

## 2016-04-03 DIAGNOSIS — F411 Generalized anxiety disorder: Secondary | ICD-10-CM

## 2016-04-17 ENCOUNTER — Other Ambulatory Visit: Payer: Self-pay | Admitting: Sports Medicine

## 2016-04-17 DIAGNOSIS — M19071 Primary osteoarthritis, right ankle and foot: Secondary | ICD-10-CM

## 2016-04-24 ENCOUNTER — Ambulatory Visit (INDEPENDENT_AMBULATORY_CARE_PROVIDER_SITE_OTHER): Payer: BLUE CROSS/BLUE SHIELD | Admitting: Physician Assistant

## 2016-04-24 ENCOUNTER — Encounter: Payer: Self-pay | Admitting: Physician Assistant

## 2016-04-24 VITALS — BP 107/70 | HR 92 | Temp 99.2°F | Wt 187.0 lb

## 2016-04-24 DIAGNOSIS — R6889 Other general symptoms and signs: Secondary | ICD-10-CM | POA: Diagnosis not present

## 2016-04-24 MED ORDER — OSELTAMIVIR PHOSPHATE 75 MG PO CAPS: 75.0000 mg | ORAL_CAPSULE | Freq: Two times a day (BID) | ORAL | 0 refills | Status: DC

## 2016-04-24 NOTE — Progress Notes (Signed)
HPI:                                                                Kolben Defreese is a 66 y.o. male who presents to [facility]: [place] today for fever and flu-like symptoms  Patient endorses fever of 102 last night accompanied by sudden onset chills, myalgias, sore throat and cough. Cough is nonproductive. He denies nausea, vomiting, diarrhea. Denies congestion, ear pain. He is currently taking Ibuprofen and OTC cough syrup. He is a 0.5ppd smoker.  Past Medical History:  Diagnosis Date  . Hyperlipidemia   . Hypertension    Past Surgical History:  Procedure Laterality Date  . HERNIA REPAIR     Social History  Substance Use Topics  . Smoking status: Current Every Day Smoker    Packs/day: 0.50    Years: 35.00  . Smokeless tobacco: Never Used  . Alcohol use Yes     Comment: 2 beers daily   family history includes Cancer in his father and mother; Hypertension in his mother.  ROS: negative except as noted in the HPI  Medications: Current Outpatient Prescriptions  Medication Sig Dispense Refill  . aspirin 81 MG tablet Take 81 mg by mouth daily.    . calcipotriene-betamethasone (TACLONEX) ointment Apply topically daily. 100 g 11  . celecoxib (CELEBREX) 200 MG capsule ONE TO 2 CAPSULES BY MOUTH DAILY AS NEEDED FOR PAIN. 60 capsule 2  . losartan (COZAAR) 50 MG tablet TAKE 1 TABLET (50 MG TOTAL) BY MOUTH DAILY. 90 tablet 3  . mometasone (ELOCON) 0.1 % cream Apply 1 application topically daily. 45 g 11  . sertraline (ZOLOFT) 25 MG tablet TAKE 1 TABLET (25 MG TOTAL) BY MOUTH DAILY. 30 tablet 2  . valsartan (DIOVAN) 160 MG tablet TAKE 1 TABLET (160 MG TOTAL) BY MOUTH DAILY. 30 tablet 3  . Vitamin D, Ergocalciferol, (DRISDOL) 50000 units CAPS capsule Take 1 capsule (50,000 Units total) by mouth every 7 (seven) days. Take for 8 total doses(weeks) 8 capsule 0   No current facility-administered medications for this visit.    Allergies  Allergen  Reactions  . Doxycycline Nausea And Vomiting       Objective:  There were no vitals taken for this visit. Gen: well-groomed, cooperative, ill-appearing, not toxic appearing, no distress HEENT: normal conjunctiva, TM's clear, nasal mucosa normal, oropharynx clear, moist mucus membranes, no frontal or maxillary sinus tenderness Pulm: Normal work of breathing, normal phonation, clear to auscultation bilaterally, no wheezes, rales or rhonchi CV: Normal rate, regular rhythm, s1 and s2 distinct, no murmurs, clicks or rubs  Neuro: alert and oriented x 3, EOM's intact Lymph: no cervical or tonsillar adenopathy Skin: warm and dry, no rashes or lesions on exposed skin, no cyanosis   No results found for this or any previous visit (from the past 72 hour(s)). No results found.    Assessment and Plan: 66 y.o. male with   1. Flu-like symptoms - he is within the window for Tamiflu - push PO fluids - symptomatic management with Ibuprofen for fever/myalgias - recommend smoking cessation - explained that he is at increased risk for pneumonia and he should return if he develops worsening wheezing, dyspnea, productive cough - oseltamivir (TAMIFLU) 75 MG capsule; Take  1 capsule (75 mg total) by mouth 2 (two) times daily.  Dispense: 10 capsule; Refill: 0   Patient education and anticipatory guidance given Patient agrees with treatment plan Follow-up as needed if symptoms worsen or fail to improve within 5 days  Darlyne Russian PA-C

## 2016-04-24 NOTE — Patient Instructions (Addendum)

## 2016-04-27 ENCOUNTER — Ambulatory Visit (INDEPENDENT_AMBULATORY_CARE_PROVIDER_SITE_OTHER): Payer: BLUE CROSS/BLUE SHIELD | Admitting: Physician Assistant

## 2016-04-27 VITALS — BP 128/74 | HR 76 | Temp 98.2°F | Wt 191.0 lb

## 2016-04-27 DIAGNOSIS — J019 Acute sinusitis, unspecified: Secondary | ICD-10-CM | POA: Diagnosis not present

## 2016-04-27 MED ORDER — AZITHROMYCIN 250 MG PO TABS: ORAL_TABLET | ORAL | 0 refills | Status: DC

## 2016-04-27 MED ORDER — IPRATROPIUM BROMIDE 0.06 % NA SOLN: 1.0000 | Freq: Four times a day (QID) | NASAL | 1 refills | Status: DC | PRN

## 2016-04-27 NOTE — Progress Notes (Signed)
HPI:                                                                Keith Moses is a 66 y.o. male who presents to [facility]: [place] today for sinus pressure  Patient reports right-sided facial pressure and foul odor like "rancid bacon." Denies fever, chills, cough, wheezing, or dyspnea. He is requesting a Z-pak and states "Z-pak usually clears this right up." I saw patient on 04/24/16 for flu-like symptoms and he is currently taking Tamiflu. He is a 0.5ppd smoker.   Past Medical History:  Diagnosis Date  . Hyperlipidemia   . Hypertension    Past Surgical History:  Procedure Laterality Date  . HERNIA REPAIR     Social History  Substance Use Topics  . Smoking status: Current Every Day Smoker    Packs/day: 0.50    Years: 35.00  . Smokeless tobacco: Never Used  . Alcohol use Yes     Comment: 2 beers daily   family history includes Cancer in his father and mother; Hypertension in his mother.  ROS: negative except as noted in the HPI  Medications: Current Outpatient Prescriptions  Medication Sig Dispense Refill  . aspirin 81 MG tablet Take 81 mg by mouth daily.    . calcipotriene-betamethasone (TACLONEX) ointment Apply topically daily. 100 g 11  . celecoxib (CELEBREX) 200 MG capsule ONE TO 2 CAPSULES BY MOUTH DAILY AS NEEDED FOR PAIN. 60 capsule 2  . mometasone (ELOCON) 0.1 % cream Apply 1 application topically daily. 45 g 11  . oseltamivir (TAMIFLU) 75 MG capsule Take 1 capsule (75 mg total) by mouth 2 (two) times daily. 10 capsule 0  . sertraline (ZOLOFT) 25 MG tablet TAKE 1 TABLET (25 MG TOTAL) BY MOUTH DAILY. 30 tablet 2  . valsartan (DIOVAN) 160 MG tablet TAKE 1 TABLET (160 MG TOTAL) BY MOUTH DAILY. 30 tablet 3  . Vitamin D, Ergocalciferol, (DRISDOL) 50000 units CAPS capsule Take 1 capsule (50,000 Units total) by mouth every 7 (seven) days. Take for 8 total doses(weeks) 8 capsule 0   No current facility-administered medications for  this visit.    Allergies  Allergen Reactions  . Doxycycline Nausea And Vomiting       Objective:  BP 128/74   Pulse 76   Temp 98.2 F (36.8 C) (Oral)   Wt 191 lb (86.6 kg)   BMI 23.25 kg/m  Gen: well-groomed, cooperative, not ill-appearing, no distress HEENT: normal conjunctiva, TM's clear, nasal mucosa edematous, oropharynx clear, moist mucus membranes, no frontal or maxillary sinus tenderness Pulm: Normal work of breathing, normal phonation, clear to auscultation bilaterally, no wheezes, rales or rhonchi CV: Normal rate, regular rhythm, s1 and s2 distinct, no murmurs, clicks or rubs  GI: soft, nondistended, nontender Neuro: alert and oriented x 3, EOM's intact Lymph: no cervical or tonsillar adenopathy Skin: warm and dry, no rashes or lesions on exposed skin, no cyanosis   No results found for this or any previous visit (from the past 72 hour(s)). No results found.    Assessment and Plan: 66 y.o. male with   1. Acute non-recurrent sinusitis, unspecified location - suspect this is still a viral illness, but patient is requesting Z-pak. Risks vs. benefits of antibiotic therapy  discussed - symptomatic management with Atrovent nasal spray - complete Tamiflu - smoking cessation - azithromycin (ZITHROMAX Z-PAK) 250 MG tablet; Take 2 tablets (500 mg) on  Day 1,  followed by 1 tablet (250 mg) once daily on Days 2 through 5.  Dispense: 6 tablet; Refill: 0 - ipratropium (ATROVENT) 0.06 % nasal spray; Place 1 spray into both nostrils 4 (four) times daily as needed.  Dispense: 30 mL; Refill: 1  Patient education and anticipatory guidance given Patient agrees with treatment plan Follow-up as needed if symptoms worsen or fail to improve  Darlyne Russian PA-C

## 2016-04-30 ENCOUNTER — Encounter: Payer: Self-pay | Admitting: Physician Assistant

## 2016-05-08 ENCOUNTER — Other Ambulatory Visit: Payer: Self-pay | Admitting: Sports Medicine

## 2016-05-08 DIAGNOSIS — B351 Tinea unguium: Secondary | ICD-10-CM

## 2016-05-30 ENCOUNTER — Other Ambulatory Visit: Payer: Self-pay | Admitting: Sports Medicine

## 2016-05-30 DIAGNOSIS — I10 Essential (primary) hypertension: Secondary | ICD-10-CM

## 2016-06-16 ENCOUNTER — Other Ambulatory Visit: Payer: Self-pay | Admitting: Sports Medicine

## 2016-06-26 ENCOUNTER — Other Ambulatory Visit: Payer: Self-pay | Admitting: Sports Medicine

## 2016-06-26 DIAGNOSIS — F411 Generalized anxiety disorder: Secondary | ICD-10-CM

## 2016-07-31 ENCOUNTER — Other Ambulatory Visit: Payer: Self-pay | Admitting: Sports Medicine

## 2016-07-31 ENCOUNTER — Other Ambulatory Visit: Payer: Self-pay | Admitting: *Deleted

## 2016-07-31 DIAGNOSIS — M19071 Primary osteoarthritis, right ankle and foot: Secondary | ICD-10-CM

## 2016-07-31 MED ORDER — CELECOXIB 200 MG PO CAPS: ORAL_CAPSULE | ORAL | 0 refills | Status: DC

## 2016-10-06 ENCOUNTER — Other Ambulatory Visit: Payer: Self-pay | Admitting: Sports Medicine

## 2016-10-06 DIAGNOSIS — F411 Generalized anxiety disorder: Secondary | ICD-10-CM

## 2016-10-19 ENCOUNTER — Telehealth: Payer: Self-pay | Admitting: Sports Medicine

## 2016-10-19 MED ORDER — IRBESARTAN 150 MG PO TABS: 150.0000 mg | ORAL_TABLET | Freq: Every day | ORAL | 3 refills | Status: DC

## 2016-10-19 NOTE — Telephone Encounter (Signed)
Patient currently in valsartan 160, recalled, switching to Avapro 150.

## 2016-11-03 ENCOUNTER — Other Ambulatory Visit: Payer: Self-pay | Admitting: Sports Medicine

## 2016-11-03 DIAGNOSIS — B351 Tinea unguium: Secondary | ICD-10-CM

## 2016-11-10 ENCOUNTER — Encounter: Payer: Self-pay | Admitting: Sports Medicine

## 2016-11-10 ENCOUNTER — Ambulatory Visit (INDEPENDENT_AMBULATORY_CARE_PROVIDER_SITE_OTHER): Payer: BLUE CROSS/BLUE SHIELD | Admitting: Sports Medicine

## 2016-11-10 DIAGNOSIS — R509 Fever, unspecified: Secondary | ICD-10-CM | POA: Diagnosis not present

## 2016-11-10 LAB — BASIC METABOLIC PANEL
BUN: 13 (ref 4–21)
CREATININE: 0.7 (ref 0.6–1.3)
Glucose: 66
POTASSIUM: 4 (ref 3.4–5.3)
Sodium: 139 (ref 137–147)

## 2016-11-10 LAB — HEPATIC FUNCTION PANEL
ALK PHOS: 81 (ref 25–125)
ALT: 15 (ref 10–40)
AST: 17 (ref 14–40)
Bilirubin, Total: 0.8

## 2016-11-10 LAB — CBC AND DIFFERENTIAL
Hemoglobin: 14.5 (ref 13.5–17.5)
WBC: 9.8

## 2016-11-10 LAB — PSA: PSA: 18

## 2016-11-10 NOTE — Assessment & Plan Note (Addendum)
Checking some labs, including influenza and mononucleosis testing. May use Tylenol for symptom relief. Stay out of work for a few days and keep hydrated. No pulmonary symptoms so avoiding chest x-ray, I am going to add a urinalysis and urine culture, he is having some increasing obstructive urinary symptoms suspicious for prostatitis. Return to see me in 2 weeks.

## 2016-11-10 NOTE — Progress Notes (Signed)
  Subjective:    CC: Fever  HPI: For 4 days this 66 year old male has had a fever up to 101 Fahrenheit, muscle aches, body aches, weakness. He had a bit of a cough initially but now has no cough, no sniffles, no runny nose, no sore throat. No shortness of breath. No chest pain. No abdominal pain, no nausea, vomiting or diarrhea, constipation. He does endorse a bit of increased difficulty voiding without dysuria, urgency or frequency. Only some hesitancy, does have a history of prostatitis.  Past medical history:  Negative.  See flowsheet/record as well for more information.  Surgical history: Negative.  See flowsheet/record as well for more information.  Family history: Negative.  See flowsheet/record as well for more information.  Social history: Negative.  See flowsheet/record as well for more information.  Allergies, and medications have been entered into the medical record, reviewed, and no changes needed.   Review of Systems: No fevers, chills, night sweats, weight loss, chest pain, or shortness of breath.   Objective:    General: Well Developed, well nourished, and in no acute distress.  Neuro: Alert and oriented x3, extra-ocular muscles intact, sensation grossly intact.  HEENT: Normocephalic, atraumatic, pupils equal round reactive to light, neck supple, no masses, no lymphadenopathy, thyroid nonpalpable. Oropharynx, nasopharynx, ear canals unremarkable Skin: Warm and dry, no rashes. Cardiac: Regular rate and rhythm, no murmurs rubs or gallops, no lower extremity edema.  Respiratory: Clear to auscultation bilaterally. Not using accessory muscles, speaking in full sentences. Abdomen: Soft, nontender, nondistended, bowel sounds, palpable masses, guarding, rigidity, rebound pain.  Impression and Recommendations:    Fever of undetermined origin Checking some labs, including influenza and mononucleosis testing. May use Tylenol for symptom relief. Stay out of work for a few days and  keep hydrated. No pulmonary symptoms so avoiding chest x-ray, I am going to add a urinalysis and urine culture, he is having some increasing obstructive urinary symptoms suspicious for prostatitis. Return to see me in 2 weeks.  ___________________________________________ [name]. Dianah Field, M.D., ABFM., CAQSM. Primary Care and [place] Instructor of [place] [employer] [facility] [employer]

## 2016-11-10 NOTE — Addendum Note (Signed)
Addended by: Silverio Decamp on: 11/10/2016 02:08 PM   Modules accepted: Orders

## 2016-11-11 LAB — URINE CULTURE
MICRO NUMBER:: 81030654
Result:: NO GROWTH
SPECIMEN QUALITY:: ADEQUATE

## 2016-11-12 ENCOUNTER — Other Ambulatory Visit: Payer: Self-pay | Admitting: Sports Medicine

## 2016-11-12 DIAGNOSIS — F411 Generalized anxiety disorder: Secondary | ICD-10-CM

## 2016-11-13 LAB — COMPREHENSIVE METABOLIC PANEL WITH GFR
ALT: 15 U/L (ref 9–46)
CO2: 25 mmol/L (ref 20–32)
Chloride: 105 mmol/L (ref 98–110)
Glucose, Bld: 66 mg/dL (ref 65–99)
Potassium: 4 mmol/L (ref 3.5–5.3)
Total Protein: 6.9 g/dL (ref 6.1–8.1)

## 2016-11-13 LAB — URINALYSIS
Bilirubin Urine: NEGATIVE
Glucose, UA: NEGATIVE
Ketones, ur: NEGATIVE
Leukocytes, UA: NEGATIVE
Nitrite: NEGATIVE
Protein, ur: NEGATIVE
Specific Gravity, Urine: 1.005 (ref 1.001–1.03)
pH: 6 (ref 5.0–8.0)

## 2016-11-13 LAB — PSA, TOTAL AND FREE
PSA, % Free: 18 % (calc) — ABNORMAL LOW (ref >25)
PSA, Free: 0.2 ng/mL
PSA, Total: 1.1 ng/mL (ref <=4.0)

## 2016-11-13 LAB — COMPREHENSIVE METABOLIC PANEL
AG Ratio: 1.4 (calc) (ref 1.0–2.5)
AST: 17 U/L (ref 10–35)
Albumin: 4 g/dL (ref 3.6–5.1)
Alkaline phosphatase (APISO): 81 U/L (ref 40–115)
BUN: 13 mg/dL (ref 7–25)
Calcium: 9.1 mg/dL (ref 8.6–10.3)
Creat: 0.73 mg/dL (ref 0.70–1.25)
Globulin: 2.9 g/dL (calc) (ref 1.9–3.7)
Sodium: 139 mmol/L (ref 135–146)
Total Bilirubin: 0.8 mg/dL (ref 0.2–1.2)

## 2016-11-13 LAB — CBC WITH DIFFERENTIAL/PLATELET
Basophils Absolute: 49 {cells}/uL (ref 0–200)
Basophils Relative: 0.5 %
Eosinophils Absolute: 235 cells/uL (ref 15–500)
Eosinophils Relative: 2.4 %
HCT: 41.9 % (ref 38.5–50.0)
Hemoglobin: 14.5 g/dL (ref 13.2–17.1)
Lymphs Abs: 2058 cells/uL (ref 850–3900)
MCH: 32 pg (ref 27.0–33.0)
MCHC: 34.6 g/dL (ref 32.0–36.0)
MCV: 92.5 fL (ref 80.0–100.0)
MPV: 9.3 fL (ref 7.5–12.5)
Monocytes Relative: 11.9 %
Neutro Abs: 6292 {cells}/uL (ref 1500–7800)
Neutrophils Relative %: 64.2 %
Platelets: 279 10*3/uL (ref 140–400)
RBC: 4.53 10*6/uL (ref 4.20–5.80)
RDW: 12.8 % (ref 11.0–15.0)
Total Lymphocyte: 21 %
WBC mixed population: 1166 cells/uL — ABNORMAL HIGH (ref 200–950)
WBC: 9.8 10*3/uL (ref 3.8–10.8)

## 2016-11-13 LAB — CK: Total CK: 52 U/L (ref 44–196)

## 2016-11-13 LAB — INFLUENZA A & B ANTIBODIES
INFLUENZA TYPE A ANTIBODY SERUM: 1:32 {titer} — ABNORMAL HIGH
INFLUENZA TYPE B ANTIBODY SERUM: 1:8 {titer} — ABNORMAL HIGH

## 2016-11-13 LAB — EPSTEIN-BARR VIRUS VCA, IGM: EBV VCA IgM: <36 U/mL

## 2016-11-13 LAB — EPSTEIN-BARR VIRUS VCA, IGG: EBV VCA IgG: 438 U/mL — ABNORMAL HIGH

## 2016-11-13 LAB — CMV IGM: CMV IgM: <30 [AU]/ml

## 2016-11-17 LAB — CULTURE BLOOD MANUAL
Micro Number: 81035317
Result: NO GROWTH
Specimen Quality: ADEQUATE

## 2016-12-08 ENCOUNTER — Other Ambulatory Visit: Payer: Self-pay | Admitting: Sports Medicine

## 2016-12-08 DIAGNOSIS — I10 Essential (primary) hypertension: Secondary | ICD-10-CM

## 2016-12-09 ENCOUNTER — Other Ambulatory Visit: Payer: Self-pay

## 2016-12-09 ENCOUNTER — Encounter: Payer: Self-pay | Admitting: Sports Medicine

## 2016-12-09 DIAGNOSIS — F411 Generalized anxiety disorder: Secondary | ICD-10-CM

## 2016-12-09 LAB — INFLUENZA A & B ANTIBODIES
Influenza A Ab: 1:32 {titer}
Influenza B Ab: 1:8 {titer}

## 2016-12-09 MED ORDER — SERTRALINE HCL 25 MG PO TABS: 25.0000 mg | ORAL_TABLET | Freq: Every day | ORAL | 2 refills | Status: DC

## 2016-12-12 ENCOUNTER — Other Ambulatory Visit: Payer: Self-pay | Admitting: Sports Medicine

## 2016-12-12 DIAGNOSIS — F411 Generalized anxiety disorder: Secondary | ICD-10-CM

## 2016-12-18 ENCOUNTER — Other Ambulatory Visit: Payer: Self-pay | Admitting: *Deleted

## 2016-12-18 MED ORDER — IRBESARTAN 150 MG PO TABS: 150.0000 mg | ORAL_TABLET | Freq: Every day | ORAL | 0 refills | Status: DC

## 2017-03-20 ENCOUNTER — Other Ambulatory Visit: Payer: Self-pay | Admitting: Sports Medicine

## 2017-04-06 ENCOUNTER — Other Ambulatory Visit: Payer: Self-pay | Admitting: Sports Medicine

## 2017-04-06 DIAGNOSIS — F411 Generalized anxiety disorder: Secondary | ICD-10-CM

## 2017-05-19 ENCOUNTER — Other Ambulatory Visit: Payer: Self-pay

## 2017-05-19 DIAGNOSIS — F411 Generalized anxiety disorder: Secondary | ICD-10-CM

## 2017-05-19 MED ORDER — SERTRALINE HCL 25 MG PO TABS: 25.0000 mg | ORAL_TABLET | Freq: Every day | ORAL | 0 refills | Status: DC

## 2017-10-21 ENCOUNTER — Telehealth: Payer: Self-pay | Admitting: *Deleted

## 2017-10-21 DIAGNOSIS — I10 Essential (primary) hypertension: Secondary | ICD-10-CM

## 2017-10-21 MED ORDER — IRBESARTAN 150 MG PO TABS: 150.0000 mg | ORAL_TABLET | Freq: Every day | ORAL | 0 refills | Status: DC

## 2017-10-21 MED ORDER — VALSARTAN 160 MG PO TABS: 160.0000 mg | ORAL_TABLET | Freq: Every day | ORAL | 3 refills | Status: DC

## 2017-10-21 NOTE — Telephone Encounter (Signed)
Pt left vm stating that his pharmacy has been out of his Irbesartan for about 1.5 months. He said he's left a message before but never got a call back.  He is requesting a new BP be sent to his pharmacy in place of this.

## 2017-10-21 NOTE — Telephone Encounter (Signed)
Medication refilled, he should probably get in for a physical and fasting labs over the next month or so.

## 2017-10-21 NOTE — Telephone Encounter (Signed)
Irbesartan is on back order and he needs a different medication sent in.

## 2017-10-21 NOTE — Telephone Encounter (Signed)
LMOM notifying pt that a new BP medication has been sent to his pharmacy.

## 2017-10-21 NOTE — Telephone Encounter (Signed)
Swoooops, misunderstood.  Switching to valsartan.

## 2017-12-08 ENCOUNTER — Other Ambulatory Visit: Payer: Self-pay | Admitting: Sports Medicine

## 2017-12-08 DIAGNOSIS — F411 Generalized anxiety disorder: Secondary | ICD-10-CM

## 2017-12-14 ENCOUNTER — Other Ambulatory Visit: Payer: Self-pay | Admitting: Sports Medicine

## 2018-01-04 ENCOUNTER — Other Ambulatory Visit: Payer: Self-pay | Admitting: Sports Medicine

## 2018-01-04 DIAGNOSIS — F411 Generalized anxiety disorder: Secondary | ICD-10-CM

## 2018-01-05 ENCOUNTER — Telehealth: Payer: Self-pay | Admitting: Sports Medicine

## 2018-01-05 DIAGNOSIS — R509 Fever, unspecified: Secondary | ICD-10-CM

## 2018-01-05 DIAGNOSIS — I10 Essential (primary) hypertension: Secondary | ICD-10-CM

## 2018-01-05 NOTE — Telephone Encounter (Signed)
Labs ordered.

## 2018-01-05 NOTE — Telephone Encounter (Signed)
Left VM with status update.  

## 2018-01-05 NOTE — Telephone Encounter (Signed)
Patient is requesting labs prior to physical on 11/21.

## 2018-01-06 ENCOUNTER — Telehealth: Payer: Self-pay | Admitting: Sports Medicine

## 2018-01-06 ENCOUNTER — Ambulatory Visit (INDEPENDENT_AMBULATORY_CARE_PROVIDER_SITE_OTHER): Payer: Medicare Other | Admitting: Sports Medicine

## 2018-01-06 DIAGNOSIS — Z23 Encounter for immunization: Secondary | ICD-10-CM | POA: Diagnosis not present

## 2018-01-06 DIAGNOSIS — I10 Essential (primary) hypertension: Secondary | ICD-10-CM

## 2018-01-06 DIAGNOSIS — F411 Generalized anxiety disorder: Secondary | ICD-10-CM

## 2018-01-06 MED ORDER — SERTRALINE HCL 25 MG PO TABS: 25.0000 mg | ORAL_TABLET | Freq: Every day | ORAL | 0 refills | Status: DC

## 2018-01-06 MED ORDER — VALSARTAN 160 MG PO TABS: 160.0000 mg | ORAL_TABLET | Freq: Every day | ORAL | 1 refills | Status: DC

## 2018-01-06 MED ORDER — ATORVASTATIN CALCIUM 20 MG PO TABS: ORAL_TABLET | ORAL | 2 refills | Status: DC

## 2018-01-06 NOTE — Telephone Encounter (Signed)
Pt wanted Rx's sent to mail order, has physical scheduled for next week.

## 2018-01-07 DIAGNOSIS — I10 Essential (primary) hypertension: Secondary | ICD-10-CM | POA: Diagnosis not present

## 2018-01-07 DIAGNOSIS — R509 Fever, unspecified: Secondary | ICD-10-CM | POA: Diagnosis not present

## 2018-01-10 LAB — TSH: TSH: 2.31 mIU/L (ref 0.40–4.50)

## 2018-01-10 LAB — PSA, TOTAL AND FREE
PSA, % Free: 14 % — ABNORMAL LOW (ref >25)
PSA, Free: 0.1 ng/mL
PSA, Total: 0.7 ng/mL (ref <=4.0)

## 2018-01-10 LAB — LIPID PANEL W/REFLEX DIRECT LDL
Cholesterol: 187 mg/dL (ref <200)
HDL: 80 mg/dL (ref >40)
LDL Cholesterol (Calc): 93 mg/dL
Non-HDL Cholesterol (Calc): 107 mg/dL (calc) (ref <130)
Total CHOL/HDL Ratio: 2.3 (calc) (ref <5.0)
Triglycerides: 46 mg/dL (ref <150)

## 2018-01-10 LAB — CBC
HCT: 40.1 % (ref 38.5–50.0)
Hemoglobin: 13.8 g/dL (ref 13.2–17.1)
MCH: 32.4 pg (ref 27.0–33.0)
MCHC: 34.4 g/dL (ref 32.0–36.0)
MCV: 94.1 fL (ref 80.0–100.0)
MPV: 9.1 fL (ref 7.5–12.5)
Platelets: 247 10*3/uL (ref 140–400)
RBC: 4.26 10*6/uL (ref 4.20–5.80)
RDW: 12.4 % (ref 11.0–15.0)
WBC: 8.2 10*3/uL (ref 3.8–10.8)

## 2018-01-10 LAB — COMPREHENSIVE METABOLIC PANEL WITH GFR
ALT: 11 U/L (ref 9–46)
BUN: 19 mg/dL (ref 7–25)
Calcium: 9.1 mg/dL (ref 8.6–10.3)
Total Bilirubin: 0.5 mg/dL (ref 0.2–1.2)

## 2018-01-10 LAB — COMPREHENSIVE METABOLIC PANEL
AG Ratio: 1.4 (calc) (ref 1.0–2.5)
AST: 20 U/L (ref 10–35)
Albumin: 3.8 g/dL (ref 3.6–5.1)
Alkaline phosphatase (APISO): 76 U/L (ref 40–115)
CO2: 27 mmol/L (ref 20–32)
Chloride: 106 mmol/L (ref 98–110)
Creat: 0.87 mg/dL (ref 0.70–1.25)
Globulin: 2.7 g/dL (calc) (ref 1.9–3.7)
Glucose, Bld: 90 mg/dL (ref 65–99)
Potassium: 3.9 mmol/L (ref 3.5–5.3)
Sodium: 140 mmol/L (ref 135–146)
Total Protein: 6.5 g/dL (ref 6.1–8.1)

## 2018-01-13 ENCOUNTER — Encounter: Payer: Self-pay | Admitting: Sports Medicine

## 2018-01-13 ENCOUNTER — Other Ambulatory Visit: Payer: Self-pay | Admitting: Sports Medicine

## 2018-01-13 ENCOUNTER — Ambulatory Visit (INDEPENDENT_AMBULATORY_CARE_PROVIDER_SITE_OTHER): Payer: Medicare Other | Admitting: Sports Medicine

## 2018-01-13 DIAGNOSIS — Z122 Encounter for screening for malignant neoplasm of respiratory organs: Secondary | ICD-10-CM | POA: Diagnosis not present

## 2018-01-13 DIAGNOSIS — E785 Hyperlipidemia, unspecified: Secondary | ICD-10-CM | POA: Diagnosis not present

## 2018-01-13 DIAGNOSIS — M19041 Primary osteoarthritis, right hand: Secondary | ICD-10-CM | POA: Insufficient documentation

## 2018-01-13 DIAGNOSIS — F411 Generalized anxiety disorder: Secondary | ICD-10-CM

## 2018-01-13 DIAGNOSIS — I872 Venous insufficiency (chronic) (peripheral): Secondary | ICD-10-CM

## 2018-01-13 DIAGNOSIS — I10 Essential (primary) hypertension: Secondary | ICD-10-CM

## 2018-01-13 DIAGNOSIS — Z0000 Encounter for general adult medical examination without abnormal findings: Secondary | ICD-10-CM

## 2018-01-13 DIAGNOSIS — F17200 Nicotine dependence, unspecified, uncomplicated: Secondary | ICD-10-CM

## 2018-01-13 DIAGNOSIS — M19042 Primary osteoarthritis, left hand: Secondary | ICD-10-CM

## 2018-01-13 MED ORDER — MELOXICAM 15 MG PO TABS: ORAL_TABLET | ORAL | 3 refills | Status: DC

## 2018-01-13 NOTE — Assessment & Plan Note (Signed)
Well controlled 

## 2018-01-13 NOTE — Assessment & Plan Note (Signed)
Well-controlled on valsartan.

## 2018-01-13 NOTE — Patient Instructions (Addendum)
Return in 1 year for a repeat annual physical, you will get a phone call when we get another shipment of pneumonia vaccines.  Go downstairs to schedule your chest CT.   Chronic Venous Insufficiency Chronic venous insufficiency, also called venous stasis, is a condition that prevents blood from being pumped effectively through the veins in your legs. Blood may no longer be pumped effectively from the legs back to the heart. This condition can range from mild to severe. With proper treatment, you should be able to continue with an active life. What are the causes? Chronic venous insufficiency occurs when the vein walls become stretched, weakened, or damaged, or when valves within the vein are damaged. Some common causes of this include:  High blood pressure inside the veins (venous hypertension).  Increased blood pressure in the leg veins from long periods of sitting or standing.  A blood clot that blocks blood flow in a vein (deep vein thrombosis, DVT).  Inflammation of a vein (phlebitis) that causes a blood clot to form.  Tumors in the pelvis that cause blood to back up.  What increases the risk? The following factors may make you more likely to develop this condition:  Having a family history of this condition.  Obesity.  Pregnancy.  Living without enough physical activity or exercise (sedentary lifestyle).  Smoking.  Having a job that requires long periods of standing or sitting in one place.  Being a certain age. Women in their 54s and 79s and men in their 13s are more likely to develop this condition.  What are the signs or symptoms? Symptoms of this condition include:  Veins that are enlarged, bulging, or twisted (varicose veins).  Skin breakdown or ulcers.  Reddened or discolored skin on the front of the leg.  Brown, smooth, tight, and painful skin just above the ankle, usually on the inside of the leg (lipodermatosclerosis).  Swelling.  How is this  diagnosed? This condition may be diagnosed based on:  Your medical history.  A physical exam.  Tests, such as: ? A procedure that creates an image of a blood vessel and nearby organs and provides information about blood flow through the blood vessel (duplex ultrasound). ? A procedure that tests blood flow (plethysmography). ? A procedure to look at the veins using X-ray and dye (venogram).  How is this treated? The goals of treatment are to help you return to an active life and to minimize pain or disability. Treatment depends on the severity of your condition, and it may include:  Wearing compression stockings. These can help relieve symptoms and help prevent your condition from getting worse. However, they do not cure the condition.  Sclerotherapy. This is a procedure involving an injection of a material that "dissolves" damaged veins.  Surgery. This may involve: ? Removing a diseased vein (vein stripping). ? Cutting off blood flow through the vein (laser ablation surgery). ? Repairing a valve.  Follow these instructions at home:  Wear compression stockings as told by your health care provider. These stockings help to prevent blood clots and reduce swelling in your legs.  Take over-the-counter and prescription medicines only as told by your health care provider.  Stay active by exercising, walking, or doing different activities. Ask your health care provider what activities are safe for you and how much exercise you need.  Drink enough fluid to keep your urine clear or pale yellow.  Do not use any products that contain nicotine or tobacco, such as cigarettes and e-cigarettes.  If you need help quitting, ask your health care provider.  Keep all follow-up visits as told by your health care provider. This is important. Contact a health care provider if:  You have redness, swelling, or more pain in the affected area.  You see a red streak or line that extends up or down from the  affected area.  You have skin breakdown or a loss of skin in the affected area, even if the breakdown is small.  You get an injury in the affected area. Get help right away if:  You get an injury and an open wound in the affected area.  You have severe pain that does not get better with medicine.  You have sudden numbness or weakness in the foot or ankle below the affected area, or you have trouble moving your foot or ankle.  You have a fever and you have worse or persistent symptoms.  You have chest pain.  You have shortness of breath. Summary  Chronic venous insufficiency, also called venous stasis, is a condition that prevents blood from being pumped effectively through the veins in your legs.  Chronic venous insufficiency occurs when the vein walls become stretched, weakened, or damaged, or when valves within the vein are damaged.  Treatment for this condition depends on how severe your condition is, and it may involve wearing compression stockings or having a procedure.  Make sure you stay active by exercising, walking, or doing different activities. Ask your health care provider what activities are safe for you and how much exercise you need. This information is not intended to replace advice given to you by your health care provider. Make sure you discuss any questions you have with your health care provider. Document Released: 06/15/2006 Document Revised: 12/30/2015 Document Reviewed: 12/30/2015 Elsevier Interactive Patient Education  2017 [employer].

## 2018-01-13 NOTE — Assessment & Plan Note (Signed)
Annual physical as above, pneumonia 23 today. He is going to decide between Cologuard and colonoscopy, he did have a colonoscopy with a few polyps but these were benign and he was advised for 10-year follow-up.

## 2018-01-13 NOTE — Assessment & Plan Note (Signed)
Extremely well-controlled on sertraline 25, he is now retired and would like to come off of the medication which is acceptable, he can call me if he would like to restart.

## 2018-01-13 NOTE — Assessment & Plan Note (Signed)
Fairly diffuse, starting meloxicam in the evening.

## 2018-01-13 NOTE — Assessment & Plan Note (Signed)
He is due for another low-dose lung cancer screening CT. He is pre-contemplative with regards to quitting smoking, I have advised Chantix, it has worked well in the past.

## 2018-01-13 NOTE — Progress Notes (Signed)
Subjective:    CC: Annual physical  HPI:  Hypertension: Well-controlled.  Hyperlipidemia: Well-controlled.  Smoker: Persistent, pre-contemplative, 1 pack/day, has smoked for many decades, several years ago we did a low-dose screening CT with a repeat CT recommended in a year.  Preventive measures: Due for pneumonia 23 vaccination.  Leg swelling: Minimal symptoms, symmetric, no orthopnea, PND.  No chest pain or shortness of breath.  Worse after long days standing up.  Bilateral hand pain: Localized at the MCPs, as well as the IP joint of the thumb, does respond well to over-the-counter Aleve but he does not get sufficient duration of response.  No trauma, no mechanical symptoms.  Generalized anxiety: Much better since retiring, he would like to discontinue his sertraline, no suicidal or homicidal ideation.  I reviewed the past medical history, family history, social history, surgical history, and allergies today and no changes were needed.  Please see the problem list section below in epic for further details.  Past Medical History: Past Medical History:  Diagnosis Date  . Hyperlipidemia   . Hypertension    Past Surgical History: Past Surgical History:  Procedure Laterality Date  . HERNIA REPAIR     Social History: Social History   Socioeconomic History  . Marital status: Married    Spouse name: Not on file  . Number of children: Not on file  . Years of education: Not on file  . Highest education level: Not on file  Occupational History  . Not on file  Social Needs  . Financial resource strain: Not on file  . Food insecurity:    Worry: Not on file    Inability: Not on file  . Transportation needs:    Medical: Not on file    Non-medical: Not on file  Tobacco Use  . Smoking status: Current Every Day Smoker    Packs/day: 0.50    Years: 35.00    Pack years: 17.50  . Smokeless tobacco: Never Used  Substance and Sexual Activity  . Alcohol use: Yes    Comment: 2  beers daily  . Drug use: No  . Sexual activity: Not on file  Lifestyle  . Physical activity:    Days per week: Not on file    Minutes per session: Not on file  . Stress: Not on file  Relationships  . Social connections:    Talks on phone: Not on file    Gets together: Not on file    Attends religious service: Not on file    Active member of club or organization: Not on file    Attends meetings of clubs or organizations: Not on file    Relationship status: Not on file  Other Topics Concern  . Not on file  Social History Narrative  . Not on file   Family History: Family History  Problem Relation Age of Onset  . Hypertension Mother   . Cancer Mother   . Cancer Father        prostate and stomach CA   Allergies: Allergies  Allergen Reactions  . Doxycycline Nausea And Vomiting   Medications: See med rec.  Review of Systems: No headache, visual changes, nausea, vomiting, diarrhea, constipation, dizziness, abdominal pain, skin rash, fevers, chills, night sweats, swollen lymph nodes, weight loss, chest pain, body aches, joint swelling, muscle aches, shortness of breath, mood changes, visual or auditory hallucinations.  Objective:    General: Well Developed, well nourished, and in no acute distress.  Neuro: Alert and oriented x3, extra-ocular  muscles intact, sensation grossly intact. Cranial nerves II through XII are intact, motor, sensory, and coordinative functions are all intact. HEENT: Normocephalic, atraumatic, pupils equal round reactive to light, neck supple, no masses, no lymphadenopathy, thyroid nonpalpable. Oropharynx, nasopharynx, external ear canals are unremarkable. Skin: Warm and dry, no rashes noted.  Cardiac: Regular rate and rhythm, no murmurs rubs or gallops.  Respiratory: Clear to auscultation bilaterally. Not using accessory muscles, speaking in full sentences.  Abdominal: Soft, nontender, nondistended, positive bowel sounds, no masses, no organomegaly.    Musculoskeletal: Shoulder, elbow, wrist, hip, knee, ankle stable, and with full range of motion.  Hemosiderin deposits on both legs.  Negative Homans sign bilaterally.  Impression and Recommendations:    The patient was counselled, risk factors were discussed, anticipatory guidance given.  Annual physical exam Annual physical as above, pneumonia 23 today. He is going to decide between Cologuard and colonoscopy, he did have a colonoscopy with a few polyps but these were benign and he was advised for 10-year follow-up.  Smoker He is due for another low-dose lung cancer screening CT. He is pre-contemplative with regards to quitting smoking, I have advised Chantix, it has worked well in the past.  Hyperlipidemia Well-controlled.  Essential hypertension, benign Well-controlled on valsartan.  Generalized anxiety disorder Extremely well-controlled on sertraline 25, he is now retired and would like to come off of the medication which is acceptable, he can call me if he would like to restart.  Primary osteoarthritis of both hands Fairly diffuse, starting meloxicam in the evening.  Venous stasis dermatitis of both lower extremities Patient will get lower extremity compression hose. ___________________________________________ [name]. Dianah Field, M.D., ABFM., CAQSM. Primary Care and Sports Medicine [facility]  Adjunct Professor of [place] [employer] [facility] of Medicine

## 2018-01-13 NOTE — Assessment & Plan Note (Signed)
Patient will get lower extremity compression hose.

## 2018-01-14 ENCOUNTER — Ambulatory Visit: Payer: Medicare Other

## 2018-01-14 ENCOUNTER — Ambulatory Visit (INDEPENDENT_AMBULATORY_CARE_PROVIDER_SITE_OTHER): Payer: Medicare Other | Admitting: Sports Medicine

## 2018-01-14 VITALS — Temp 97.9°F

## 2018-01-14 DIAGNOSIS — Z23 Encounter for immunization: Secondary | ICD-10-CM

## 2018-01-14 DIAGNOSIS — F17200 Nicotine dependence, unspecified, uncomplicated: Secondary | ICD-10-CM

## 2018-01-14 DIAGNOSIS — Z122 Encounter for screening for malignant neoplasm of respiratory organs: Secondary | ICD-10-CM

## 2018-01-14 NOTE — Progress Notes (Signed)
Pt came into clinic today for Pneumococcal Polysaccharide-23 vaccine. Tolerated injection in left deltoid well, no immediate complications. Advised to contact clinic with any questions or concerns.

## 2018-01-18 ENCOUNTER — Encounter: Payer: Self-pay | Admitting: Sports Medicine

## 2018-01-29 ENCOUNTER — Other Ambulatory Visit: Payer: Self-pay | Admitting: Sports Medicine

## 2018-03-09 ENCOUNTER — Other Ambulatory Visit: Payer: Self-pay | Admitting: Sports Medicine

## 2018-03-09 DIAGNOSIS — F411 Generalized anxiety disorder: Secondary | ICD-10-CM

## 2018-06-18 ENCOUNTER — Other Ambulatory Visit: Payer: Self-pay | Admitting: Sports Medicine

## 2018-06-18 DIAGNOSIS — I10 Essential (primary) hypertension: Secondary | ICD-10-CM

## 2018-06-25 ENCOUNTER — Other Ambulatory Visit: Payer: Self-pay | Admitting: Sports Medicine

## 2018-06-25 DIAGNOSIS — F411 Generalized anxiety disorder: Secondary | ICD-10-CM

## 2018-09-07 ENCOUNTER — Other Ambulatory Visit: Payer: Self-pay

## 2018-09-07 NOTE — Patient Outreach (Signed)
[place]) Care Management  09/07/2018  Keith Moses 07/01/1950 [id number]   Medication Adherence call to Keith Moses HIPPA Compliant Voice message left with a call back number. Keith Moses is showing past due on Atorvastatin 20 mg under Keith Moses.   Keith Moses Management Direct Dial [phone]  Fax [phone] [email or web address]

## 2018-10-25 ENCOUNTER — Other Ambulatory Visit: Payer: Self-pay | Admitting: Sports Medicine

## 2018-11-01 ENCOUNTER — Other Ambulatory Visit: Payer: Self-pay | Admitting: Sports Medicine

## 2018-11-01 DIAGNOSIS — F411 Generalized anxiety disorder: Secondary | ICD-10-CM

## 2018-11-02 MED ORDER — SERTRALINE HCL 25 MG PO TABS: 25.0000 mg | ORAL_TABLET | Freq: Every day | ORAL | 0 refills | Status: DC

## 2018-12-05 ENCOUNTER — Other Ambulatory Visit: Payer: Self-pay | Admitting: Sports Medicine

## 2018-12-05 DIAGNOSIS — I10 Essential (primary) hypertension: Secondary | ICD-10-CM

## 2018-12-08 ENCOUNTER — Other Ambulatory Visit: Payer: Self-pay

## 2018-12-08 NOTE — Patient Outreach (Signed)
Keith Moses) Care Management  12/08/2018  Keith Moses 09/06/1950 [id number]   Medication Adherence call to Keith Moses HIPPA Compliant Voice message left with a call back number. Keith Moses is showing past due on Atorvastatin 20 mg under Keith Moses.   [facility] Direct Dial [phone]  Fax [phone] [email or web address]

## 2018-12-21 ENCOUNTER — Other Ambulatory Visit: Payer: Self-pay | Admitting: Sports Medicine

## 2018-12-21 DIAGNOSIS — F411 Generalized anxiety disorder: Secondary | ICD-10-CM

## 2018-12-28 ENCOUNTER — Ambulatory Visit (INDEPENDENT_AMBULATORY_CARE_PROVIDER_SITE_OTHER): Payer: Medicare Other | Admitting: Family Medicine

## 2018-12-28 ENCOUNTER — Encounter: Payer: Self-pay | Admitting: Family Medicine

## 2018-12-28 DIAGNOSIS — Z23 Encounter for immunization: Secondary | ICD-10-CM | POA: Diagnosis not present

## 2018-12-28 NOTE — Progress Notes (Signed)
Flu givnen

## 2018-12-30 ENCOUNTER — Other Ambulatory Visit: Payer: Self-pay | Admitting: Sports Medicine

## 2018-12-30 DIAGNOSIS — M19042 Primary osteoarthritis, left hand: Secondary | ICD-10-CM

## 2018-12-30 DIAGNOSIS — M19041 Primary osteoarthritis, right hand: Secondary | ICD-10-CM

## 2019-08-03 ENCOUNTER — Ambulatory Visit (INDEPENDENT_AMBULATORY_CARE_PROVIDER_SITE_OTHER): Payer: Medicare Other

## 2019-08-03 ENCOUNTER — Other Ambulatory Visit: Payer: Self-pay

## 2019-08-03 ENCOUNTER — Ambulatory Visit (INDEPENDENT_AMBULATORY_CARE_PROVIDER_SITE_OTHER): Payer: Medicare Other | Admitting: Sports Medicine

## 2019-08-03 ENCOUNTER — Encounter: Payer: Self-pay | Admitting: Sports Medicine

## 2019-08-03 DIAGNOSIS — M48061 Spinal stenosis, lumbar region without neurogenic claudication: Secondary | ICD-10-CM | POA: Insufficient documentation

## 2019-08-03 DIAGNOSIS — M5416 Radiculopathy, lumbar region: Secondary | ICD-10-CM

## 2019-08-03 DIAGNOSIS — M19071 Primary osteoarthritis, right ankle and foot: Secondary | ICD-10-CM | POA: Diagnosis not present

## 2019-08-03 DIAGNOSIS — M5136 Other intervertebral disc degeneration, lumbar region: Secondary | ICD-10-CM | POA: Diagnosis not present

## 2019-08-03 MED ORDER — PREDNISONE 50 MG PO TABS: ORAL_TABLET | ORAL | 0 refills | Status: DC

## 2019-08-03 MED ORDER — GABAPENTIN 300 MG PO CAPS: ORAL_CAPSULE | ORAL | 3 refills | Status: DC

## 2019-08-03 NOTE — Progress Notes (Signed)
    Procedures performed today:    None.  Independent interpretation of notes and tests performed by another provider:   None.  Brief History, Exam, Impression, and Recommendations:    Right lumbar radiculitis This is a pleasant 69 year old male, he was playing golf, and developed pain radiating from his right buttock all the way down his leg to his lateral lower leg and anterolateral foot. Worse with standing, better with leaning forward and sitting. He likely has some spinal stenosis manifesting as a right L5 radiculitis. Adding home rehab exercises, he did decline formal PT, 5 days of prednisone, gabapentin, lumbar spine and foot x-rays per his request. Return to see me in 4 to 6 weeks, MRI for interventional planning if no better.    ___________________________________________ [name]. Dianah Field, M.D., ABFM., CAQSM. Primary Care and [place] Instructor of [place] of [facility] of Medicine

## 2019-08-03 NOTE — Assessment & Plan Note (Signed)
This is a pleasant 69 year old male, he was playing golf, and developed pain radiating from his right buttock all the way down his leg to his lateral lower leg and anterolateral foot. Worse with standing, better with leaning forward and sitting. He likely has some spinal stenosis manifesting as a right L5 radiculitis. Adding home rehab exercises, he did decline formal PT, 5 days of prednisone, gabapentin, lumbar spine and foot x-rays per his request. Return to see me in 4 to 6 weeks, MRI for interventional planning if no better.

## 2019-09-11 ENCOUNTER — Other Ambulatory Visit: Payer: Self-pay | Admitting: Sports Medicine

## 2019-09-14 ENCOUNTER — Ambulatory Visit: Payer: Medicare Other | Admitting: Sports Medicine

## 2019-09-21 ENCOUNTER — Ambulatory Visit (INDEPENDENT_AMBULATORY_CARE_PROVIDER_SITE_OTHER): Payer: Medicare Other | Admitting: Sports Medicine

## 2019-09-21 ENCOUNTER — Encounter: Payer: Self-pay | Admitting: Sports Medicine

## 2019-09-21 DIAGNOSIS — N400 Enlarged prostate without lower urinary tract symptoms: Secondary | ICD-10-CM

## 2019-09-21 DIAGNOSIS — Z0000 Encounter for general adult medical examination without abnormal findings: Secondary | ICD-10-CM

## 2019-09-21 DIAGNOSIS — M5416 Radiculopathy, lumbar region: Secondary | ICD-10-CM

## 2019-09-21 DIAGNOSIS — I10 Essential (primary) hypertension: Secondary | ICD-10-CM | POA: Diagnosis not present

## 2019-09-21 NOTE — Progress Notes (Signed)
    Procedures performed today:    None.  Independent interpretation of notes and tests performed by another provider:   None.  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 Symptoms were better with standing and leaning forward all consistent with spinal stenosis manifesting as a right L5 and S1 radiculitis.  We added prednisone, gabapentin, he is improved considerably, has not really done his rehab exercises, today we discussed the evolutionary anthropology of low back pain, he will get more diligent with his rehab and return to see me on an as-needed basis for this.   Annual physical exam Routine labs ordered, he is due for colonoscopy, return for a physical.    ___________________________________________ [name]. Dianah Field, M.D., ABFM., CAQSM. Primary Care and [place] Instructor of [place] of [facility] of Medicine

## 2019-09-21 NOTE — Assessment & Plan Note (Signed)
Routine labs ordered, he is due for colonoscopy, return for a physical.

## 2019-09-21 NOTE — Assessment & Plan Note (Signed)
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 Symptoms were better with standing and leaning forward all consistent with spinal stenosis manifesting as a right L5 and S1 radiculitis.  We added prednisone, gabapentin, he is improved considerably, has not really done his rehab exercises, today we discussed the evolutionary anthropology of low back pain, he will get more diligent with his rehab and return to see me on an as-needed basis for this.

## 2019-10-02 DIAGNOSIS — I10 Essential (primary) hypertension: Secondary | ICD-10-CM | POA: Diagnosis not present

## 2019-10-03 LAB — COMPLETE METABOLIC PANEL WITH GFR
AG Ratio: 1.7 (calc) (ref 1.0–2.5)
ALT: 14 U/L (ref 9–46)
AST: 20 U/L (ref 10–35)
Albumin: 4 g/dL (ref 3.6–5.1)
Alkaline phosphatase (APISO): 69 U/L (ref 35–144)
BUN: 20 mg/dL (ref 7–25)
CO2: 24 mmol/L (ref 20–32)
Calcium: 9.1 mg/dL (ref 8.6–10.3)
Chloride: 109 mmol/L (ref 98–110)
Creat: 0.98 mg/dL (ref 0.70–1.25)
GFR, Est African American: 91 mL/min/{1.73_m2} (ref >=60)
GFR, Est Non African American: 79 mL/min/{1.73_m2} (ref >=60)
Globulin: 2.4 g/dL (calc) (ref 1.9–3.7)
Glucose, Bld: 87 mg/dL (ref 65–99)
Potassium: 4 mmol/L (ref 3.5–5.3)
Sodium: 139 mmol/L (ref 135–146)
Total Bilirubin: 0.6 mg/dL (ref 0.2–1.2)
Total Protein: 6.4 g/dL (ref 6.1–8.1)

## 2019-10-03 LAB — CBC
HCT: 41.5 % (ref 38.5–50.0)
Hemoglobin: 14.1 g/dL (ref 13.2–17.1)
MCH: 32.4 pg (ref 27.0–33.0)
MCHC: 34 g/dL (ref 32.0–36.0)
MCV: 95.4 fL (ref 80.0–100.0)
MPV: 9.1 fL (ref 7.5–12.5)
Platelets: 264 10*3/uL (ref 140–400)
RBC: 4.35 10*6/uL (ref 4.20–5.80)
RDW: 12.5 % (ref 11.0–15.0)
WBC: 6.5 10*3/uL (ref 3.8–10.8)

## 2019-10-03 LAB — TSH: TSH: 1.89 mIU/L (ref 0.40–4.50)

## 2019-10-03 LAB — LIPID PANEL W/REFLEX DIRECT LDL
Cholesterol: 186 mg/dL (ref <200)
HDL: 72 mg/dL (ref >=40)
LDL Cholesterol (Calc): 100 mg/dL (calc) — ABNORMAL HIGH
Non-HDL Cholesterol (Calc): 114 mg/dL (calc) (ref <130)
Total CHOL/HDL Ratio: 2.6 (calc) (ref <5.0)
Triglycerides: 47 mg/dL (ref <150)

## 2019-10-03 LAB — PSA, TOTAL AND FREE
PSA, % Free: 20 % (calc) — ABNORMAL LOW (ref >25)
PSA, Free: 0.2 ng/mL
PSA, Total: 1 ng/mL (ref <=4.0)

## 2019-10-24 ENCOUNTER — Encounter: Payer: Self-pay | Admitting: Sports Medicine

## 2019-10-24 ENCOUNTER — Other Ambulatory Visit: Payer: Self-pay

## 2019-10-24 ENCOUNTER — Ambulatory Visit (INDEPENDENT_AMBULATORY_CARE_PROVIDER_SITE_OTHER): Payer: Medicare Other | Admitting: Sports Medicine

## 2019-10-24 VITALS — BP 124/72 | HR 85 | Temp 98.1°F | Ht 76.0 in | Wt 183.0 lb

## 2019-10-24 DIAGNOSIS — N321 Vesicointestinal fistula: Secondary | ICD-10-CM | POA: Insufficient documentation

## 2019-10-24 DIAGNOSIS — N410 Acute prostatitis: Secondary | ICD-10-CM

## 2019-10-24 DIAGNOSIS — R319 Hematuria, unspecified: Secondary | ICD-10-CM | POA: Diagnosis not present

## 2019-10-24 LAB — POCT URINALYSIS DIP (CLINITEK)
Bilirubin, UA: NEGATIVE
Glucose, UA: NEGATIVE mg/dL
Ketones, POC UA: NEGATIVE mg/dL
Nitrite, UA: NEGATIVE
POC PROTEIN,UA: NEGATIVE
Spec Grav, UA: >=1.03 — AB (ref 1.010–1.025)
Urobilinogen, UA: 0.2 E.U./dL
pH, UA: 6 (ref 5.0–8.0)

## 2019-10-24 MED ORDER — TAMSULOSIN HCL 0.4 MG PO CAPS: 0.4000 mg | ORAL_CAPSULE | Freq: Every day | ORAL | 3 refills | Status: DC

## 2019-10-24 MED ORDER — CIPROFLOXACIN HCL 750 MG PO TABS: 750.0000 mg | ORAL_TABLET | Freq: Two times a day (BID) | ORAL | 0 refills | Status: AC

## 2019-10-24 NOTE — Patient Instructions (Signed)

## 2019-10-24 NOTE — Assessment & Plan Note (Signed)
This is a very pleasant 69 year old male, for the past couple of days he has had some blood in his urine, for the most part no dysuria or pain, no fullness in the perineum, he does have several episodes of nocturia. He does have leukocytes and blood in his urine with a high specific gravity. No abdominal pain with unremarkable abdominal exam and no costovertebral angle pain with palpation or percussion. This is likely a prostatitis. Adding Cipro for 2 weeks, Flomax, return to see me in a month, if still spilling blood in his urine we will do imaging.

## 2019-10-24 NOTE — Progress Notes (Signed)
    Procedures performed today:    None.  Independent interpretation of notes and tests performed by another provider:   None.  Brief History, Exam, Impression, and Recommendations:    Keith Moses is a pleasant 69yo male who presents today with blood in his urine. He noticed the blood on Sunday when he was urinatign in the morning. He reports episodes of nocturia recently. He also reports intermittent episodes of difficulty urinating. He does have blood and leukocytes in his urine. This is most likely prostatitis. We are going to treat with ciprofloxacin for 2 weeks. If no better, than we will get imaging to look for renal abnormalities that could be leading to this pain. He will follow up in 4-6 weeks.  Marcelino Duster, MS3   ___________________________________________ [name]. Dianah Field, M.D., ABFM., CAQSM. Primary Care and [place] Instructor of [place] of [facility] of Medicine

## 2019-10-25 LAB — URINE CULTURE
MICRO NUMBER:: 10893957
Result:: NO GROWTH
SPECIMEN QUALITY:: ADEQUATE

## 2019-10-29 ENCOUNTER — Other Ambulatory Visit: Payer: Self-pay | Admitting: Sports Medicine

## 2019-10-29 DIAGNOSIS — I10 Essential (primary) hypertension: Secondary | ICD-10-CM

## 2019-11-10 ENCOUNTER — Other Ambulatory Visit: Payer: Self-pay

## 2019-11-10 ENCOUNTER — Ambulatory Visit (INDEPENDENT_AMBULATORY_CARE_PROVIDER_SITE_OTHER): Payer: Medicare Other | Admitting: Nurse Practitioner

## 2019-11-10 ENCOUNTER — Encounter: Payer: Self-pay | Admitting: Nurse Practitioner

## 2019-11-10 VITALS — BP 123/78 | HR 74 | Temp 97.9°F | Ht 76.0 in | Wt 185.0 lb

## 2019-11-10 DIAGNOSIS — R300 Dysuria: Secondary | ICD-10-CM | POA: Diagnosis not present

## 2019-11-10 DIAGNOSIS — R319 Hematuria, unspecified: Secondary | ICD-10-CM | POA: Diagnosis not present

## 2019-11-10 LAB — POCT URINALYSIS DIP (CLINITEK)
Bilirubin, UA: NEGATIVE
Glucose, UA: NEGATIVE mg/dL
Ketones, POC UA: NEGATIVE mg/dL
Nitrite, UA: NEGATIVE
POC PROTEIN,UA: NEGATIVE
Spec Grav, UA: 1.02 (ref 1.010–1.025)
Urobilinogen, UA: 0.2 E.U./dL
pH, UA: 6.5 (ref 5.0–8.0)

## 2019-11-10 MED ORDER — CIPROFLOXACIN HCL 500 MG PO TABS: 500.0000 mg | ORAL_TABLET | Freq: Two times a day (BID) | ORAL | 0 refills | Status: DC

## 2019-11-10 NOTE — Assessment & Plan Note (Signed)
Ongoing presence of hematuria now with new onset of dysuria, malaise, fatigue, and feverish feeling.  Point-of-care urinalysis today was positive for moderate amount of blood and trace leukocytes. Strongly suspect that this is ongoing prostatitis infection that has not been completely cleared with 2 weeks of oral ciprofloxacin. Discussed with the patient that prostatitis to his treatment may take up to 6 weeks or longer due to the availability of medications to actively working in the prostate. Discussed the option of urology referral for further evaluation given the ongoing hematuria and new symptoms that are presented.  Patient declines urology referral at this time and would like to try 2 more weeks on the antibiotic.  A joint decision was made to continue the antibiotic until he has a follow-up appointment with his primary care provider and at that time if symptoms are persisting will consider urology referral or possible prostate ultrasound. Plan to follow-up with PCP in approximately 2 weeks-appointment already made. If symptoms worsen prior to appointment time recommend patient contact the office for follow-up evaluation. Prescription for ciprofloxacin 500 mg twice daily x14 days provided.  Continue tamsulosin 0.4 mg daily.

## 2019-11-10 NOTE — Progress Notes (Signed)
Acute Office Visit  Subjective:    Patient ID: Keith Moses, male    DOB: 1950-06-07, 69 y.o.   MRN: [id number]  Chief Complaint  Patient presents with  . Dysuria    recently treated for hematuria and acute prostatitis on 10/24/2019, finished atbx, has a f/up scheduled with Dr. Darene Lamer but did not want to wait to be seen again    HPI Keith Moses is a 69 year old male who returns today with recurrence of blood in his urine after recent 2-week treatment with ciprofloxacin for suspected prostatitis.  He reports that he completed his ciprofloxacin course on Wednesday morning.  During the time he was taking the medication he reports resolution of symptoms with no pain and only one instance Keith Moses on of noting small amount of blood in the urine.  He reports by Thursday morning he began to experience more blood in the urine, burning with urination, feeling as though he had a fever, generalized malaise, and fatigue.  He does report that he has been taking the tamsulosin which has helped significantly with his flow, which was week and he was experiencing urinary hesitancy and frequency prior to the start of this medication. He does have concerns that there is infection still present.  He denies abdominal pain, pelvic pain, elevated body temperature, chills, headache, nausea, vomiting, diarrhea.  He does endorse hematuria, dysuria, fatigue, malaise.   Past Medical History:  Diagnosis Date  . Hyperlipidemia   . Hypertension     Past Surgical History:  Procedure Laterality Date  . HERNIA REPAIR      Family History  Problem Relation Age of Onset  . Hypertension Mother   . Cancer Mother   . Cancer Father        prostate and stomach CA    Social History   Socioeconomic History  . Marital status: Married    Spouse name: Not on file  . Number of children: Not on file  . Years of education: Not on file  . Highest education level: Not on file  Occupational History  . Not on file  Tobacco Use   . Smoking status: Current Every Day Smoker    Packs/day: 0.50    Years: 35.00    Pack years: 17.50  . Smokeless tobacco: Never Used  Substance and Sexual Activity  . Alcohol use: Yes    Comment: 2 beers daily  . Drug use: No  . Sexual activity: Not on file  Other Topics Concern  . Not on file  Social History Narrative  . Not on file   Social Determinants of Health   Financial Resource Strain:   . Difficulty of Paying Living Expenses: Not on file  Food Insecurity:   . Worried About Charity fundraiser in the Last Year: Not on file  . Ran Out of Food in the Last Year: Not on file  Transportation Needs:   . Lack of Transportation (Medical): Not on file  . Lack of Transportation (Non-Medical): Not on file  Physical Activity:   . Days of Exercise per Week: Not on file  . Minutes of Exercise per Session: Not on file  Stress:   . Feeling of Stress : Not on file  Social Connections:   . Frequency of Communication with Friends and Family: Not on file  . Frequency of Social Gatherings with Friends and Family: Not on file  . Attends Religious Services: Not on file  . Active Member of Clubs or Organizations: Not on file  .  Attends Archivist Meetings: Not on file  . Marital Status: Not on file  Intimate Partner Violence:   . Fear of Current or Ex-Partner: Not on file  . Emotionally Abused: Not on file  . Physically Abused: Not on file  . Sexually Abused: Not on file    Outpatient Medications Prior to Visit  Medication Sig Dispense Refill  . aspirin 81 MG tablet Take 81 mg by mouth daily.    Marland Kitchen atorvastatin (LIPITOR) 20 MG tablet TAKE 1 TABLET BY MOUTH  DAILY 90 tablet 3  . meloxicam (MOBIC) 15 MG tablet TAKE 1/2 TO 1 TABLET BY  MOUTH WITH DINNER 90 tablet 3  . tamsulosin (FLOMAX) 0.4 MG CAPS capsule Take 1 capsule (0.4 mg total) by mouth daily after breakfast. 90 capsule 3  . valsartan (DIOVAN) 160 MG tablet TAKE 1 TABLET BY MOUTH  DAILY 90 tablet 3  .  calcipotriene-betamethasone (TACLONEX) ointment Apply topically daily. (Patient not taking: Reported on 11/10/2019) 100 g 11  . gabapentin (NEURONTIN) 300 MG capsule One tab PO qHS for a week, then BID for a week, then TID. May double weekly to a max of 3,600mg /day (Patient not taking: Reported on 11/10/2019) 90 capsule 3  . mometasone (ELOCON) 0.1 % cream Apply 1 application topically daily. (Patient not taking: Reported on 11/10/2019) 45 g 11  . sertraline (ZOLOFT) 25 MG tablet Take 1 tablet (25 mg total) by mouth daily. NEEDS APPT (Patient not taking: Reported on 11/10/2019) 30 tablet 0   No facility-administered medications prior to visit.    Allergies  Allergen Reactions  . Doxycycline Nausea And Vomiting    Review of Systems All systems reviewed and pertinent positives and negatives listed in HPI.    Objective:    Physical Exam Vitals and nursing note reviewed.  Constitutional:      Appearance: Normal appearance. He is normal weight.  HENT:     Head: Normocephalic.  Eyes:     Extraocular Movements: Extraocular movements intact.     Conjunctiva/sclera: Conjunctivae normal.     Pupils: Pupils are equal, round, and reactive to light.  Cardiovascular:     Rate and Rhythm: Normal rate and regular rhythm.     Pulses: Normal pulses.     Heart sounds: Normal heart sounds.  Pulmonary:     Effort: Pulmonary effort is normal.     Breath sounds: Normal breath sounds.  Abdominal:     General: Abdomen is flat. Bowel sounds are normal.     Palpations: Abdomen is soft.     Tenderness: There is no abdominal tenderness. There is no guarding.  Musculoskeletal:        General: Normal range of motion.     Cervical back: Normal range of motion.     Right lower leg: No edema.     Left lower leg: No edema.  Skin:    General: Skin is warm and dry.     Capillary Refill: Capillary refill takes less than 2 seconds.  Neurological:     General: No focal deficit present.     Mental Status: He is  alert and oriented to person, place, and time.  Psychiatric:        Mood and Affect: Mood normal.        Behavior: Behavior normal.        Thought Content: Thought content normal.        Judgment: Judgment normal.     BP 123/78   Pulse 74  Temp 97.9 F (36.6 C) (Oral)   Ht 6\' 4"  (1.93 m)   Wt 185 lb (83.9 kg)   SpO2 98%   BMI 22.52 kg/m  Wt Readings from Last 3 Encounters:  11/10/19 185 lb (83.9 kg)  10/24/19 183 lb (83 kg)  01/13/18 170 lb (77.1 kg)    There are no preventive care reminders to display for this patient.  There are no preventive care reminders to display for this patient.   Lab Results  Component Value Date   TSH 1.89 10/02/2019   Lab Results  Component Value Date   WBC 6.5 10/02/2019   HGB 14.1 10/02/2019   HCT 41.5 10/02/2019   MCV 95.4 10/02/2019   PLT 264 10/02/2019   Lab Results  Component Value Date   NA 139 10/02/2019   K 4.0 10/02/2019   CO2 24 10/02/2019   GLUCOSE 87 10/02/2019   BUN 20 10/02/2019   CREATININE 0.98 10/02/2019   BILITOT 0.6 10/02/2019   ALKPHOS 81 11/10/2016   AST 20 10/02/2019   ALT 14 10/02/2019   PROT 6.4 10/02/2019   ALBUMIN 4.0 01/09/2016   CALCIUM 9.1 10/02/2019   Lab Results  Component Value Date   CHOL 186 10/02/2019   Lab Results  Component Value Date   HDL 72 10/02/2019   Lab Results  Component Value Date   LDLCALC 100 (H) 10/02/2019   Lab Results  Component Value Date   TRIG 47 10/02/2019   Lab Results  Component Value Date   CHOLHDL 2.6 10/02/2019   Lab Results  Component Value Date   HGBA1C 5.2 01/09/2016       Assessment & Plan:   Problem List Items Addressed This Visit      Other   Hematuria - Primary    Ongoing presence of hematuria now with new onset of dysuria, malaise, fatigue, and feverish feeling.  Point-of-care urinalysis today was positive for moderate amount of blood and trace leukocytes. Strongly suspect that this is ongoing prostatitis infection that has not  been completely cleared with 2 weeks of oral ciprofloxacin. Discussed with the patient that prostatitis to his treatment may take up to 6 weeks or longer due to the availability of medications to actively working in the prostate. Discussed the option of urology referral for further evaluation given the ongoing hematuria and new symptoms that are presented.  Patient declines urology referral at this time and would like to try 2 more weeks on the antibiotic.  A joint decision was made to continue the antibiotic until he has a follow-up appointment with his primary care provider and at that time if symptoms are persisting will consider urology referral or possible prostate ultrasound. Plan to follow-up with PCP in approximately 2 weeks-appointment already made. If symptoms worsen prior to appointment time recommend patient contact the office for follow-up evaluation. Prescription for ciprofloxacin 500 mg twice daily x14 days provided.  Continue tamsulosin 0.4 mg daily.      Relevant Medications   ciprofloxacin (CIPRO) 500 MG tablet   Dysuria   Relevant Medications   ciprofloxacin (CIPRO) 500 MG tablet   Other Relevant Orders   POCT URINALYSIS DIP (CLINITEK)   Urine Culture       Meds ordered this encounter  Medications  . ciprofloxacin (CIPRO) 500 MG tablet    Sig: Take 1 tablet (500 mg total) by mouth 2 (two) times daily.    Dispense:  28 tablet    Refill:  0   Patient has  appointment for follow-up with PCP in approximately 2 weeks.  At that time will determine if continuation of antibiotics is warranted or ultrasound needs to be placed or possible urology referral.  Orma Render, NP

## 2019-11-10 NOTE — Patient Instructions (Signed)

## 2019-11-12 LAB — URINE CULTURE
MICRO NUMBER:: 10966657
Result:: NO GROWTH
SPECIMEN QUALITY:: ADEQUATE

## 2019-11-13 NOTE — Progress Notes (Signed)
No growth on urine culture, this is likely due to the previous 2 weeks of antibiotic use. Please continue antibiotic treatment and follow-up with Dr. Darene Lamer as scheduled. If symptoms persist, recommend referral to urology for further workup.

## 2019-11-20 ENCOUNTER — Other Ambulatory Visit: Payer: Self-pay | Admitting: Sports Medicine

## 2019-11-21 ENCOUNTER — Ambulatory Visit: Payer: Medicare Other | Admitting: Sports Medicine

## 2019-11-23 ENCOUNTER — Other Ambulatory Visit: Payer: Self-pay | Admitting: Sports Medicine

## 2019-11-23 DIAGNOSIS — M19041 Primary osteoarthritis, right hand: Secondary | ICD-10-CM

## 2019-11-27 ENCOUNTER — Ambulatory Visit (INDEPENDENT_AMBULATORY_CARE_PROVIDER_SITE_OTHER): Payer: Medicare Other | Admitting: Sports Medicine

## 2019-11-27 ENCOUNTER — Encounter: Payer: Self-pay | Admitting: Sports Medicine

## 2019-11-27 ENCOUNTER — Other Ambulatory Visit: Payer: Self-pay

## 2019-11-27 VITALS — BP 138/71 | HR 73 | Temp 97.8°F | Wt 184.1 lb

## 2019-11-27 DIAGNOSIS — M5416 Radiculopathy, lumbar region: Secondary | ICD-10-CM

## 2019-11-27 DIAGNOSIS — Z23 Encounter for immunization: Secondary | ICD-10-CM

## 2019-11-27 DIAGNOSIS — F411 Generalized anxiety disorder: Secondary | ICD-10-CM

## 2019-11-27 DIAGNOSIS — N410 Acute prostatitis: Secondary | ICD-10-CM

## 2019-11-27 LAB — POCT URINALYSIS DIP (CLINITEK)
Bilirubin, UA: NEGATIVE
Glucose, UA: NEGATIVE mg/dL
Ketones, POC UA: NEGATIVE mg/dL
Nitrite, UA: NEGATIVE
POC PROTEIN,UA: NEGATIVE
Spec Grav, UA: 1.01 (ref 1.010–1.025)
Urobilinogen, UA: 0.2 E.U./dL
pH, UA: 6 (ref 5.0–8.0)

## 2019-11-27 MED ORDER — SERTRALINE HCL 25 MG PO TABS: 25.0000 mg | ORAL_TABLET | Freq: Every day | ORAL | 3 refills | Status: DC

## 2019-11-27 MED ORDER — GABAPENTIN 300 MG PO CAPS: 300.0000 mg | ORAL_CAPSULE | Freq: Every day | ORAL | 3 refills | Status: DC | PRN

## 2019-11-27 NOTE — Assessment & Plan Note (Signed)
At this point Keith Moses has had a couple of courses of Cipro, 28 days total for chronic prostatitis, he feels pretty good. He is requesting a repeat urinalysis though he understands that whether or not there are leukocytes or blood we will not be obtaining imaging considering his improvement in symptoms. Of note is not uncommon to need 6 weeks of treatment for chronic prostatitis.

## 2019-11-27 NOTE — Progress Notes (Signed)
    Procedures performed today:    None.  Independent interpretation of notes and tests performed by another provider:   None.  Brief History, Exam, Impression, and Recommendations:    Prostatitis At this point Keith Moses has had a couple of courses of Cipro, 28 days total for chronic prostatitis, he feels pretty good. He is requesting a repeat urinalysis though he understands that whether or not there are leukocytes or blood we will not be obtaining imaging considering his improvement in symptoms. Of note is not uncommon to need 6 weeks of treatment for chronic prostatitis.  Generalized anxiety disorder [name] had come off of his sertraline sometime ago, increasing anxiety, went back on it and feels good, refilling.  Right lumbar radiculitis No lumbar DDD, well controlled with gabapentin, refilling.    ___________________________________________ [name]. Dianah Field, M.D., ABFM., CAQSM. Primary Care and [place] Instructor of [place] of [facility] of Medicine

## 2019-11-27 NOTE — Assessment & Plan Note (Signed)
Keith Moses had come off of his sertraline sometime ago, increasing anxiety, went back on it and feels good, refilling.

## 2019-11-27 NOTE — Assessment & Plan Note (Signed)
No lumbar DDD, well controlled with gabapentin, refilling.

## 2019-11-27 NOTE — Addendum Note (Signed)
Addended by: Mertha Finders on: 11/27/2019 11:30 AM   Modules accepted: Orders

## 2020-01-05 ENCOUNTER — Other Ambulatory Visit: Payer: Self-pay | Admitting: Sports Medicine

## 2020-01-05 DIAGNOSIS — N410 Acute prostatitis: Secondary | ICD-10-CM

## 2020-01-05 MED ORDER — TAMSULOSIN HCL 0.4 MG PO CAPS: 0.4000 mg | ORAL_CAPSULE | Freq: Every day | ORAL | 3 refills | Status: DC

## 2020-04-01 DIAGNOSIS — H02831 Dermatochalasis of right upper eyelid: Secondary | ICD-10-CM | POA: Diagnosis not present

## 2020-04-01 DIAGNOSIS — H25813 Combined forms of age-related cataract, bilateral: Secondary | ICD-10-CM | POA: Diagnosis not present

## 2020-04-01 DIAGNOSIS — H02834 Dermatochalasis of left upper eyelid: Secondary | ICD-10-CM | POA: Diagnosis not present

## 2020-04-01 DIAGNOSIS — H43813 Vitreous degeneration, bilateral: Secondary | ICD-10-CM | POA: Diagnosis not present

## 2020-04-01 DIAGNOSIS — H527 Unspecified disorder of refraction: Secondary | ICD-10-CM | POA: Diagnosis not present

## 2020-04-01 DIAGNOSIS — H52203 Unspecified astigmatism, bilateral: Secondary | ICD-10-CM | POA: Diagnosis not present

## 2020-05-02 DIAGNOSIS — H25813 Combined forms of age-related cataract, bilateral: Secondary | ICD-10-CM | POA: Diagnosis not present

## 2020-05-02 DIAGNOSIS — H52203 Unspecified astigmatism, bilateral: Secondary | ICD-10-CM | POA: Diagnosis not present

## 2020-05-09 DIAGNOSIS — H02831 Dermatochalasis of right upper eyelid: Secondary | ICD-10-CM | POA: Diagnosis not present

## 2020-05-09 DIAGNOSIS — H52209 Unspecified astigmatism, unspecified eye: Secondary | ICD-10-CM | POA: Diagnosis not present

## 2020-05-09 DIAGNOSIS — I10 Essential (primary) hypertension: Secondary | ICD-10-CM | POA: Diagnosis not present

## 2020-05-09 DIAGNOSIS — F17200 Nicotine dependence, unspecified, uncomplicated: Secondary | ICD-10-CM | POA: Diagnosis not present

## 2020-05-09 DIAGNOSIS — E785 Hyperlipidemia, unspecified: Secondary | ICD-10-CM | POA: Diagnosis not present

## 2020-05-09 DIAGNOSIS — L409 Psoriasis, unspecified: Secondary | ICD-10-CM | POA: Diagnosis not present

## 2020-05-09 DIAGNOSIS — H25812 Combined forms of age-related cataract, left eye: Secondary | ICD-10-CM | POA: Diagnosis not present

## 2020-05-09 DIAGNOSIS — K219 Gastro-esophageal reflux disease without esophagitis: Secondary | ICD-10-CM | POA: Diagnosis not present

## 2020-05-09 DIAGNOSIS — H02834 Dermatochalasis of left upper eyelid: Secondary | ICD-10-CM | POA: Diagnosis not present

## 2020-05-09 DIAGNOSIS — H527 Unspecified disorder of refraction: Secondary | ICD-10-CM | POA: Diagnosis not present

## 2020-05-09 DIAGNOSIS — I2510 Atherosclerotic heart disease of native coronary artery without angina pectoris: Secondary | ICD-10-CM | POA: Diagnosis not present

## 2020-05-09 DIAGNOSIS — H43819 Vitreous degeneration, unspecified eye: Secondary | ICD-10-CM | POA: Diagnosis not present

## 2020-05-10 DIAGNOSIS — H43813 Vitreous degeneration, bilateral: Secondary | ICD-10-CM | POA: Diagnosis not present

## 2020-05-16 DIAGNOSIS — H43813 Vitreous degeneration, bilateral: Secondary | ICD-10-CM | POA: Diagnosis not present

## 2020-05-16 DIAGNOSIS — M1990 Unspecified osteoarthritis, unspecified site: Secondary | ICD-10-CM | POA: Diagnosis not present

## 2020-05-16 DIAGNOSIS — E785 Hyperlipidemia, unspecified: Secondary | ICD-10-CM | POA: Diagnosis not present

## 2020-05-16 DIAGNOSIS — H25811 Combined forms of age-related cataract, right eye: Secondary | ICD-10-CM | POA: Diagnosis not present

## 2020-05-16 DIAGNOSIS — I10 Essential (primary) hypertension: Secondary | ICD-10-CM | POA: Diagnosis not present

## 2020-05-16 DIAGNOSIS — H02831 Dermatochalasis of right upper eyelid: Secondary | ICD-10-CM | POA: Diagnosis not present

## 2020-05-16 DIAGNOSIS — H52203 Unspecified astigmatism, bilateral: Secondary | ICD-10-CM | POA: Diagnosis not present

## 2020-05-16 DIAGNOSIS — F17200 Nicotine dependence, unspecified, uncomplicated: Secondary | ICD-10-CM | POA: Diagnosis not present

## 2020-05-16 DIAGNOSIS — H25813 Combined forms of age-related cataract, bilateral: Secondary | ICD-10-CM | POA: Diagnosis not present

## 2020-05-16 DIAGNOSIS — I2510 Atherosclerotic heart disease of native coronary artery without angina pectoris: Secondary | ICD-10-CM | POA: Diagnosis not present

## 2020-05-16 DIAGNOSIS — K219 Gastro-esophageal reflux disease without esophagitis: Secondary | ICD-10-CM | POA: Diagnosis not present

## 2020-05-16 DIAGNOSIS — H02834 Dermatochalasis of left upper eyelid: Secondary | ICD-10-CM | POA: Diagnosis not present

## 2020-05-31 ENCOUNTER — Ambulatory Visit: Payer: Medicare Other | Admitting: Sports Medicine

## 2020-06-21 ENCOUNTER — Encounter: Payer: Self-pay | Admitting: Sports Medicine

## 2020-06-21 ENCOUNTER — Other Ambulatory Visit: Payer: Self-pay

## 2020-06-21 ENCOUNTER — Ambulatory Visit (INDEPENDENT_AMBULATORY_CARE_PROVIDER_SITE_OTHER): Payer: Medicare Other | Admitting: Sports Medicine

## 2020-06-21 VITALS — BP 151/77 | HR 59 | Ht 76.0 in | Wt 179.0 lb

## 2020-06-21 DIAGNOSIS — Z0000 Encounter for general adult medical examination without abnormal findings: Secondary | ICD-10-CM

## 2020-06-21 DIAGNOSIS — Z1211 Encounter for screening for malignant neoplasm of colon: Secondary | ICD-10-CM

## 2020-06-21 DIAGNOSIS — M7542 Impingement syndrome of left shoulder: Secondary | ICD-10-CM

## 2020-06-21 DIAGNOSIS — M5412 Radiculopathy, cervical region: Secondary | ICD-10-CM

## 2020-06-21 DIAGNOSIS — I10 Essential (primary) hypertension: Secondary | ICD-10-CM | POA: Diagnosis not present

## 2020-06-21 MED ORDER — VALSARTAN 320 MG PO TABS: 320.0000 mg | ORAL_TABLET | Freq: Every day | ORAL | 11 refills | Status: DC

## 2020-06-21 NOTE — Assessment & Plan Note (Signed)
Numbness and tingling to the dorsum of the left hand, declines imaging, symptoms are mild. Continue gabapentin, we may consider going up on the dose at the next refill, he will let me know. Adding cervical spondylosis rehab exercises, return in 4 to 6 weeks as needed for this.

## 2020-06-21 NOTE — Assessment & Plan Note (Signed)
Blood pressure is elevated, increasing Diovan to 320, return in a nurse visit in 2 weeks for a blood pressure check.

## 2020-06-21 NOTE — Progress Notes (Signed)
    Procedures performed today:    None.  Independent interpretation of notes and tests performed by another provider:   None.  Brief History, Exam, Impression, and Recommendations:    Impingement syndrome, shoulder, left Positive Neer's, Hawkins signs, adding rotator cuff conditioning exercises.  Radiculitis of left cervical region Numbness and tingling to the dorsum of the left hand, declines imaging, symptoms are mild. Continue gabapentin, we may consider going up on the dose at the next refill, he will let me know. Adding cervical spondylosis rehab exercises, return in 4 to 6 weeks as needed for this.  Annual physical exam Switching to Cologuard, has not yet gotten his colonoscopy.  Essential hypertension, benign Blood pressure is elevated, increasing Diovan to 320, return in a nurse visit in 2 weeks for a blood pressure check.    ___________________________________________ [name]. Dianah Field, M.D., ABFM., CAQSM. Primary Care and [place] Instructor of [place] of [facility] of Medicine

## 2020-06-21 NOTE — Assessment & Plan Note (Signed)
Positive Neer's, Hawkins signs, adding rotator cuff conditioning exercises.

## 2020-06-21 NOTE — Assessment & Plan Note (Signed)
Switching to Cologuard, has not yet gotten his colonoscopy.

## 2020-06-24 DIAGNOSIS — H43812 Vitreous degeneration, left eye: Secondary | ICD-10-CM | POA: Diagnosis not present

## 2020-06-24 DIAGNOSIS — H538 Other visual disturbances: Secondary | ICD-10-CM | POA: Diagnosis not present

## 2020-07-08 ENCOUNTER — Ambulatory Visit (INDEPENDENT_AMBULATORY_CARE_PROVIDER_SITE_OTHER): Payer: Medicare Other | Admitting: Sports Medicine

## 2020-07-08 ENCOUNTER — Other Ambulatory Visit: Payer: Self-pay

## 2020-07-08 VITALS — BP 119/74 | HR 68 | Temp 98.2°F | Resp 20 | Ht 76.0 in | Wt 176.0 lb

## 2020-07-08 DIAGNOSIS — I10 Essential (primary) hypertension: Secondary | ICD-10-CM

## 2020-07-08 NOTE — Assessment & Plan Note (Signed)
Well-controlled now on Diovan 320, no changes.

## 2020-07-08 NOTE — Progress Notes (Signed)
Established Patient Office Visit  Subjective:  Patient ID: Keith Moses, male    DOB: [birth year]  Age: 70 y.o. MRN: [id number]  CC:  Chief Complaint  Patient presents with  . Hypertension    HPI Keith Moses presents for a BP check. First BP 126/64, Second BP 119/74.  Past Medical History:  Diagnosis Date  . Hyperlipidemia   . Hypertension     Past Surgical History:  Procedure Laterality Date  . HERNIA REPAIR      Family History  Problem Relation Age of Onset  . Hypertension Mother   . Cancer Mother   . Cancer Father        prostate and stomach CA    Social History   Socioeconomic History  . Marital status: Married    Spouse name: Not on file  . Number of children: Not on file  . Years of education: Not on file  . Highest education level: Not on file  Occupational History  . Not on file  Tobacco Use  . Smoking status: Current Every Day Smoker    Packs/day: 0.50    Years: 35.00    Pack years: 17.50  . Smokeless tobacco: Never Used  Substance and Sexual Activity  . Alcohol use: Yes    Comment: 2 beers daily  . Drug use: No  . Sexual activity: Not on file  Other Topics Concern  . Not on file  Social History Narrative  . Not on file   Social Determinants of Health   Financial Resource Strain: Not on file  Food Insecurity: Not on file  Transportation Needs: Not on file  Physical Activity: Not on file  Stress: Not on file  Social Connections: Not on file  Intimate Partner Violence: Not on file    Outpatient Medications Prior to Visit  Medication Sig Dispense Refill  . aspirin 81 MG tablet Take 81 mg by mouth daily.    Marland Kitchen atorvastatin (LIPITOR) 20 MG tablet TAKE 1 TABLET BY MOUTH  DAILY 90 tablet 3  . gabapentin (NEURONTIN) 300 MG capsule Take 1 capsule (300 mg total) by mouth daily as needed. 90 capsule 3  . meloxicam (MOBIC) 15 MG tablet TAKE 1/2 TO 1 TABLET BY  MOUTH WITH DINNER 90 tablet 3  . sertraline (ZOLOFT) 25 MG tablet Take 1 tablet  (25 mg total) by mouth daily. 90 tablet 3  . tamsulosin (FLOMAX) 0.4 MG CAPS capsule Take 1 capsule (0.4 mg total) by mouth daily after breakfast. 90 capsule 3  . valsartan (DIOVAN) 320 MG tablet Take 1 tablet (320 mg total) by mouth daily. 30 tablet 11   No facility-administered medications prior to visit.    Allergies  Allergen Reactions  . Doxycycline Nausea And Vomiting    ROS Review of Systems    Objective:    Physical Exam  There were no vitals taken for this visit. Wt Readings from Last 3 Encounters:  06/21/20 179 lb (81.2 kg)  11/27/19 184 lb 1.9 oz (83.5 kg)  11/10/19 185 lb (83.9 kg)     There are no preventive care reminders to display for this patient.  There are no preventive care reminders to display for this patient.  Lab Results  Component Value Date   TSH 1.89 10/02/2019   Lab Results  Component Value Date   WBC 6.5 10/02/2019   HGB 14.1 10/02/2019   HCT 41.5 10/02/2019   MCV 95.4 10/02/2019   PLT 264 10/02/2019   Lab Results  Component  Value Date   NA 139 10/02/2019   K 4.0 10/02/2019   CO2 24 10/02/2019   GLUCOSE 87 10/02/2019   BUN 20 10/02/2019   CREATININE 0.98 10/02/2019   BILITOT 0.6 10/02/2019   ALKPHOS 81 11/10/2016   AST 20 10/02/2019   ALT 14 10/02/2019   PROT 6.4 10/02/2019   ALBUMIN 4.0 01/09/2016   CALCIUM 9.1 10/02/2019   Lab Results  Component Value Date   CHOL 186 10/02/2019   Lab Results  Component Value Date   HDL 72 10/02/2019   Lab Results  Component Value Date   LDLCALC 100 (H) 10/02/2019   Lab Results  Component Value Date   TRIG 47 10/02/2019   Lab Results  Component Value Date   CHOLHDL 2.6 10/02/2019   Lab Results  Component Value Date   HGBA1C 5.2 01/09/2016      Assessment & Plan:  BP within normal limits, continue current medication.  Problem List Items Addressed This Visit      Cardiovascular and Mediastinum   Essential hypertension, benign - Primary      No orders of the  defined types were placed in this encounter.   Follow-up: No follow-ups on file.    Ninfa Meeker, CMA

## 2020-07-09 DIAGNOSIS — Z1211 Encounter for screening for malignant neoplasm of colon: Secondary | ICD-10-CM | POA: Diagnosis not present

## 2020-07-11 ENCOUNTER — Encounter: Payer: Self-pay | Admitting: Family Medicine

## 2020-07-11 ENCOUNTER — Ambulatory Visit (INDEPENDENT_AMBULATORY_CARE_PROVIDER_SITE_OTHER): Payer: Medicare Other | Admitting: Family Medicine

## 2020-07-11 ENCOUNTER — Other Ambulatory Visit: Payer: Self-pay

## 2020-07-11 VITALS — BP 115/65 | HR 99 | Temp 98.1°F | Resp 17 | Wt 175.5 lb

## 2020-07-11 DIAGNOSIS — I10 Essential (primary) hypertension: Secondary | ICD-10-CM

## 2020-07-11 DIAGNOSIS — R42 Dizziness and giddiness: Secondary | ICD-10-CM

## 2020-07-11 MED ORDER — VALSARTAN 160 MG PO TABS: 160.0000 mg | ORAL_TABLET | Freq: Every day | ORAL | 3 refills | Status: DC

## 2020-07-11 NOTE — Progress Notes (Signed)
Acute Office Visit  Subjective:    Patient ID: Keith Moses, male    DOB: 05-Jul-1950, 70 y.o.   MRN: [id number]  Chief Complaint  Patient presents with  . Dizziness  . Hypotension    HPI Patient is in today for hypotension & dizziness.  Stays very active outside with projects. Has had some mild dizziness every day since increasing valsartan a few weeks ago (06/21/20) - reports he had a toothache the week he saw Dr. Darene Lamer and had been taking a lot of ibuprofen so he is wondering if that plus the pain may have contributed to the high readings that day.  Yesterday he got very dizzy and it concerned him. He was gardening and painting and was having frequent dizzy episodes (kept occurring with position changes) and around 2 pm he states it was significant enough that he felt like he should stop and lie down - rested for about 2 hours then felt somewhat better but still didn't push much activity the rest of the day. Reports he doesn't drink much water usually, but called RN yesterday and was encouraged to drink more. States he has been drinking more today but still felt mild dizziness after mowing the yard today. Dizzy episdes have been occurring almost daily for the past 2 weeks.  Describes "dizzy" as mild queasiness and feeling lightheaded like he needs to sit down quickly, clammy. Denies any chest pain, shortness of breath, vision changes, GI symptoms.  Yesterday at home after working outside: 3 pm - 89/62 4 pm - 95/57 7 pm - 108/61  Today:  8 am - 141/81 (before taking medication) 9:30 am - 131/74 (1 hour after taking medication) 12 pm - 113/66 (right after mowing lawn)  He brought in his home BP cuff today to compare to ours: His = 105/64 Ours = 104/61    Past Medical History:  Diagnosis Date  . Hyperlipidemia   . Hypertension     Past Surgical History:  Procedure Laterality Date  . HERNIA REPAIR      Family History  Problem Relation Age of Onset  . Hypertension Mother    . Cancer Mother   . Cancer Father        prostate and stomach CA    Social History   Socioeconomic History  . Marital status: Married    Spouse name: Not on file  . Number of children: Not on file  . Years of education: Not on file  . Highest education level: Not on file  Occupational History  . Not on file  Tobacco Use  . Smoking status: Current Every Day Smoker    Packs/day: 0.50    Years: 35.00    Pack years: 17.50  . Smokeless tobacco: Never Used  Substance and Sexual Activity  . Alcohol use: Yes    Comment: 2 beers daily  . Drug use: No  . Sexual activity: Not on file  Other Topics Concern  . Not on file  Social History Narrative  . Not on file   Social Determinants of Health   Financial Resource Strain: Not on file  Food Insecurity: Not on file  Transportation Needs: Not on file  Physical Activity: Not on file  Stress: Not on file  Social Connections: Not on file  Intimate Partner Violence: Not on file    Outpatient Medications Prior to Visit  Medication Sig Dispense Refill  . aspirin 81 MG tablet Take 81 mg by mouth daily.    Marland Kitchen atorvastatin (  LIPITOR) 20 MG tablet TAKE 1 TABLET BY MOUTH  DAILY 90 tablet 3  . gabapentin (NEURONTIN) 300 MG capsule Take 1 capsule (300 mg total) by mouth daily as needed. 90 capsule 3  . meloxicam (MOBIC) 15 MG tablet TAKE 1/2 TO 1 TABLET BY  MOUTH WITH DINNER 90 tablet 3  . sertraline (ZOLOFT) 25 MG tablet Take 1 tablet (25 mg total) by mouth daily. 90 tablet 3  . tamsulosin (FLOMAX) 0.4 MG CAPS capsule Take 1 capsule (0.4 mg total) by mouth daily after breakfast. 90 capsule 3  . valsartan (DIOVAN) 320 MG tablet Take 1 tablet (320 mg total) by mouth daily. 30 tablet 11   No facility-administered medications prior to visit.    Allergies  Allergen Reactions  . Doxycycline Nausea And Vomiting    Review of Systems All review of systems negative except what is listed in the HPI     Objective:    Physical Exam Vitals  reviewed.  Constitutional:      General: He is not in acute distress.    Appearance: Normal appearance. He is normal weight. He is not ill-appearing or diaphoretic.  HENT:     Head: Normocephalic and atraumatic.  Cardiovascular:     Rate and Rhythm: Normal rate and regular rhythm.     Heart sounds: Normal heart sounds.  Pulmonary:     Breath sounds: Normal breath sounds.  Musculoskeletal:        General: Normal range of motion.     Cervical back: Normal range of motion and neck supple.  Skin:    General: Skin is warm and dry.  Neurological:     General: No focal deficit present.     Mental Status: He is alert and oriented to person, place, and time.  Psychiatric:        Mood and Affect: Mood normal.        Behavior: Behavior normal.        Thought Content: Thought content normal.        Judgment: Judgment normal.       Orthostatic Vitals for the past 48 hrs (Last 6 readings):  Patient Position Orthostatic BP Orthostatic Pulse  07/11/20 1410 Supine 109/63 95  07/11/20 1412 Sitting 103/58 97  07/11/20 1414 Standing 102/60 116  07/11/20 1417 Standing 106/72 107     BP 115/65   Pulse 99   Temp 98.1 F (36.7 C)   Resp 17   Wt 175 lb 8 oz (79.6 kg)   SpO2 96%   BMI 21.36 kg/m  Wt Readings from Last 3 Encounters:  07/11/20 175 lb 8 oz (79.6 kg)  07/08/20 176 lb (79.8 kg)  06/21/20 179 lb (81.2 kg)    There are no preventive care reminders to display for this patient.  There are no preventive care reminders to display for this patient.   Lab Results  Component Value Date   TSH 1.89 10/02/2019   Lab Results  Component Value Date   WBC 6.5 10/02/2019   HGB 14.1 10/02/2019   HCT 41.5 10/02/2019   MCV 95.4 10/02/2019   PLT 264 10/02/2019   Lab Results  Component Value Date   NA 139 10/02/2019   K 4.0 10/02/2019   CO2 24 10/02/2019   GLUCOSE 87 10/02/2019   BUN 20 10/02/2019   CREATININE 0.98 10/02/2019   BILITOT 0.6 10/02/2019   ALKPHOS 81 11/10/2016    AST 20 10/02/2019   ALT 14 10/02/2019   PROT 6.4  10/02/2019   ALBUMIN 4.0 01/09/2016   CALCIUM 9.1 10/02/2019   Lab Results  Component Value Date   CHOL 186 10/02/2019   Lab Results  Component Value Date   HDL 72 10/02/2019   Lab Results  Component Value Date   LDLCALC 100 (H) 10/02/2019   Lab Results  Component Value Date   TRIG 47 10/02/2019   Lab Results  Component Value Date   CHOLHDL 2.6 10/02/2019   Lab Results  Component Value Date   HGBA1C 5.2 01/09/2016       Assessment & Plan:   1. Essential hypertension, benign 2. Dizziness Patient with some recent low BP readings at home and dizziness with position changes, though not significantly orthostatic in office today. Given low/good readings and symptoms, will drop him back down to the previous dose he was on. Recommend he check his BP daily, stay hydrated, avoid working outside during the hottest time of day, not push himself to work too hard or too long, and take frequent breaks for snack/water. Would like him to keep a log of home BP readings and come back for nurse visit in 2 weeks for BP check. Educated on signs and symptoms requiring urgent evaluation. Patient agreeable to plan.   - valsartan (DIOVAN) 160 MG tablet; Take 1 tablet (160 mg total) by mouth daily.  Dispense: 90 tablet; Refill: 3  Follow-up in 2 weeks for BP check or sooner if needed.    Terrilyn Saver, NP

## 2020-07-17 LAB — COLOGUARD
COLOGUARD: NEGATIVE
Cologuard: NEGATIVE

## 2020-07-19 ENCOUNTER — Encounter: Payer: Self-pay | Admitting: Sports Medicine

## 2020-07-19 NOTE — Progress Notes (Signed)
Cologuard results abstracted into chart, VM with results left for patient, results sent for scanning into chart.

## 2020-07-25 ENCOUNTER — Ambulatory Visit (INDEPENDENT_AMBULATORY_CARE_PROVIDER_SITE_OTHER): Payer: Medicare Other | Admitting: Sports Medicine

## 2020-07-25 ENCOUNTER — Other Ambulatory Visit: Payer: Self-pay

## 2020-07-25 DIAGNOSIS — I10 Essential (primary) hypertension: Secondary | ICD-10-CM

## 2020-07-25 MED ORDER — VALSARTAN 40 MG PO TABS: 40.0000 mg | ORAL_TABLET | Freq: Every day | ORAL | 11 refills | Status: DC

## 2020-07-25 NOTE — Progress Notes (Signed)
Established Patient Office Visit  Subjective:  Patient ID: Keith Moses, male    DOB: 07-03-1950  Age: 70 y.o. MRN: [id number]  CC:  Chief Complaint  Patient presents with  . Blood Pressure Check    HPI Keith Moses presents for a BP check. First BP 125/73 second BP 107/63.   Past Medical History:  Diagnosis Date  . Hyperlipidemia   . Hypertension     Past Surgical History:  Procedure Laterality Date  . HERNIA REPAIR      Family History  Problem Relation Age of Onset  . Hypertension Mother   . Cancer Mother   . Cancer Father        prostate and stomach CA    Social History   Socioeconomic History  . Marital status: Married    Spouse name: Not on file  . Number of children: Not on file  . Years of education: Not on file  . Highest education level: Not on file  Occupational History  . Not on file  Tobacco Use  . Smoking status: Current Every Day Smoker    Packs/day: 0.50    Years: 35.00    Pack years: 17.50  . Smokeless tobacco: Never Used  Substance and Sexual Activity  . Alcohol use: Yes    Comment: 2 beers daily  . Drug use: No  . Sexual activity: Not on file  Other Topics Concern  . Not on file  Social History Narrative  . Not on file   Social Determinants of Health   Financial Resource Strain: Not on file  Food Insecurity: Not on file  Transportation Needs: Not on file  Physical Activity: Not on file  Stress: Not on file  Social Connections: Not on file  Intimate Partner Violence: Not on file    Outpatient Medications Prior to Visit  Medication Sig Dispense Refill  . aspirin 81 MG tablet Take 81 mg by mouth daily.    Marland Kitchen atorvastatin (LIPITOR) 20 MG tablet TAKE 1 TABLET BY MOUTH  DAILY 90 tablet 3  . gabapentin (NEURONTIN) 300 MG capsule Take 1 capsule (300 mg total) by mouth daily as needed. 90 capsule 3  . meloxicam (MOBIC) 15 MG tablet TAKE 1/2 TO 1 TABLET BY  MOUTH WITH DINNER 90 tablet 3  . sertraline (ZOLOFT) 25 MG tablet Take 1  tablet (25 mg total) by mouth daily. 90 tablet 3  . tamsulosin (FLOMAX) 0.4 MG CAPS capsule Take 1 capsule (0.4 mg total) by mouth daily after breakfast. 90 capsule 3  . valsartan (DIOVAN) 160 MG tablet Take 1 tablet (160 mg total) by mouth daily. 90 tablet 3   No facility-administered medications prior to visit.    Allergies  Allergen Reactions  . Doxycycline Nausea And Vomiting    ROS Review of Systems    Objective:    Physical Exam  BP 107/63 (BP Location: Left Arm, Patient Position: Sitting, Cuff Size: Large)   Pulse 76   Temp 98.1 F (36.7 C) (Oral)   Resp 20   Ht 6\' 4"  (1.93 m)   Wt 175 lb (79.4 kg)   SpO2 99%   BMI 21.30 kg/m  Wt Readings from Last 3 Encounters:  07/25/20 175 lb (79.4 kg)  07/11/20 175 lb 8 oz (79.6 kg)  07/08/20 176 lb (79.8 kg)     Health Maintenance Due  Topic Date Due  . Zoster Vaccines- Shingrix (1 of 2) Never done    There are no preventive care reminders to  display for this patient.  Lab Results  Component Value Date   TSH 1.89 10/02/2019   Lab Results  Component Value Date   WBC 6.5 10/02/2019   HGB 14.1 10/02/2019   HCT 41.5 10/02/2019   MCV 95.4 10/02/2019   PLT 264 10/02/2019   Lab Results  Component Value Date   NA 139 10/02/2019   K 4.0 10/02/2019   CO2 24 10/02/2019   GLUCOSE 87 10/02/2019   BUN 20 10/02/2019   CREATININE 0.98 10/02/2019   BILITOT 0.6 10/02/2019   ALKPHOS 81 11/10/2016   AST 20 10/02/2019   ALT 14 10/02/2019   PROT 6.4 10/02/2019   ALBUMIN 4.0 01/09/2016   CALCIUM 9.1 10/02/2019   Lab Results  Component Value Date   CHOL 186 10/02/2019   Lab Results  Component Value Date   HDL 72 10/02/2019   Lab Results  Component Value Date   LDLCALC 100 (H) 10/02/2019   Lab Results  Component Value Date   TRIG 47 10/02/2019   Lab Results  Component Value Date   CHOLHDL 2.6 10/02/2019   Lab Results  Component Value Date   HGBA1C 5.2 01/09/2016      Assessment & Plan:  Start  Valsartan 40MG , follow up in 2 weeks for a BP check. Problem List Items Addressed This Visit      Cardiovascular and Mediastinum   Essential hypertension, benign    Iatrogenic hypotension with valsartan 160, decreasing to 40 mg, recheck in 2 weeks, goal less than 140/90 for this 70 year old male.      Relevant Medications   valsartan (DIOVAN) 40 MG tablet      Meds ordered this encounter  Medications  . valsartan (DIOVAN) 40 MG tablet    Sig: Take 1 tablet (40 mg total) by mouth daily.    Dispense:  30 tablet    Refill:  11    Requesting 1 year supply    Follow-up: Return in about 2 weeks (around 08/08/2020) for BP Check.    Ninfa Meeker, CMA

## 2020-07-25 NOTE — Assessment & Plan Note (Signed)
Iatrogenic hypotension with valsartan 160, decreasing to 40 mg, recheck in 2 weeks, goal less than 140/90 for this 70 year old male.

## 2020-08-06 ENCOUNTER — Other Ambulatory Visit: Payer: Self-pay

## 2020-08-06 ENCOUNTER — Ambulatory Visit (INDEPENDENT_AMBULATORY_CARE_PROVIDER_SITE_OTHER): Payer: Medicare Other | Admitting: Sports Medicine

## 2020-08-06 VITALS — BP 136/77 | HR 87 | Ht 76.0 in | Wt 174.1 lb

## 2020-08-06 DIAGNOSIS — Z23 Encounter for immunization: Secondary | ICD-10-CM | POA: Diagnosis not present

## 2020-08-06 DIAGNOSIS — Z0000 Encounter for general adult medical examination without abnormal findings: Secondary | ICD-10-CM | POA: Diagnosis not present

## 2020-08-06 NOTE — Progress Notes (Signed)
MEDICARE ANNUAL WELLNESS VISIT  08/06/2020  Subjective:  Keith Moses is a 70 y.o. male patient of Keith Moses, Keith Her, MD who had a Medicare Annual Wellness Visit today. Keith Moses is Retired and lives with their spouse. he has 0 children. he reports that he is socially active and does interact with friends/family regularly. he is minimally physically active and enjoys woodworking, gardening and painting.  Patient Care Team: Keith Decamp, MD as PCP - General (Sports Medicine)  Advanced Directives 08/06/2020  Does Patient Have a Medical Advance Directive? Yes  Type of Advance Directive Living will;Healthcare Power of Attorney  Does patient want to make changes to medical advance directive? No - Patient declined  Copy of [place] in Chart? No - copy requested    Hospital Utilization Over the Past 12 Months: # of hospitalizations or ER visits: 0 # of surgeries: 0  Review of Systems    Patient reports that his overall health is unchanged when compared to last year.  Review of Systems: History obtained from chart review and the patient  All other systems negative.  Pain Assessment Pain : No/denies pain     Current Medications & Allergies (verified) Allergies as of 08/06/2020       Reactions   Doxycycline Nausea And Vomiting        Medication List        Accurate as of August 06, 2020  1:54 PM. If you have any questions, ask your nurse or doctor.          aspirin 81 MG tablet Take 81 mg by mouth daily.   atorvastatin 20 MG tablet Commonly known as: LIPITOR TAKE 1 TABLET BY MOUTH  DAILY   B Complex 50 Tabs Take 1 tablet by mouth daily.   Cholecalciferol 25 MCG (1000 UT) tablet Take by mouth.   gabapentin 300 MG capsule Commonly known as: NEURONTIN Take 1 capsule (300 mg total) by mouth daily as needed.   meloxicam 15 MG tablet Commonly known as: MOBIC TAKE 1/2 TO 1 TABLET BY  MOUTH WITH DINNER   Multivitamin Adults 50+  Tabs Take 1 tablet by mouth daily.   sertraline 25 MG tablet Commonly known as: ZOLOFT Take 1 tablet (25 mg total) by mouth daily.   tamsulosin 0.4 MG Caps capsule Commonly known as: Flomax Take 1 capsule (0.4 mg total) by mouth daily after breakfast.   valsartan 40 MG tablet Commonly known as: DIOVAN Take 1 tablet (40 mg total) by mouth daily.        History (reviewed): Past Medical History:  Diagnosis Date   Hyperlipidemia    Hypertension    Past Surgical History:  Procedure Laterality Date   HERNIA REPAIR     Family History  Problem Relation Age of Onset   Hypertension Mother    Cancer Mother    Cancer Father        prostate and stomach CA   Social History   Socioeconomic History   Marital status: Married    Spouse name: Keith Moses   Number of children: 0   Years of education: 13   Highest education level: Some college, no degree  Occupational History    Comment: Retired  Tobacco Use   Smoking status: Every Day    Packs/day: 0.50    Years: 35.00    Pack years: 17.50    Types: Cigarettes   Smokeless tobacco: Never  Substance and Sexual Activity   Alcohol use: Yes  Alcohol/week: 3.0 - 4.0 standard drinks    Types: 3 - 4 Cans of beer per week    Comment: 3-4 beers daily   Drug use: No   Sexual activity: Not on file  Other Topics Concern   Not on file  Social History Narrative   Lives with wife and his brother in Sports [employer]. Enjoys woodworking, gardening and painting.   Social Determinants of Health   Financial Resource Strain: Low Risk    Difficulty of Paying Living Expenses: Not hard at all  Food Insecurity: No Food Insecurity   Worried About Charity fundraiser in the Last Year: Never true   [place] in the Last Year: Never true  Transportation Needs: No Transportation Needs   Lack of Transportation (Medical): No   Lack of Transportation (Non-Medical): No  Physical Activity: Inactive   Days of Exercise per Week: 0 days   Minutes of  Exercise per Session: 0 min  Stress: No Stress Concern Present   Feeling of Stress : Not at all  Social Connections: Moderately Integrated   Frequency of Communication with Friends and Family: Twice a week   Frequency of Social Gatherings with Friends and Family: Once a week   Attends Religious Services: 1 to 4 times per year   Active Member of [employer] or Organizations: No   Attends Archivist Meetings: Never   Marital Status: Married    Activities of Daily Living In your present state of health, do you have any difficulty performing the following activities: 08/06/2020  Hearing? N  Vision? N  Difficulty concentrating or making decisions? N  Walking or climbing stairs? N  Dressing or bathing? N  Doing errands, shopping? N  Preparing Food and eating ? N  Using the Toilet? N  In the past six months, have you accidently leaked urine? N  Do you have problems with loss of bowel control? N  Managing your Medications? N  Managing your Finances? N  Housekeeping or managing your Housekeeping? N  Some recent data might be hidden    Patient Education/Literacy How often do you need to have someone help you when you read instructions, pamphlets, or other written materials from your doctor or pharmacy?: 1 - Never What is the last grade level you completed in school?: 1 year of college.  Exercise Current Exercise Habits: Home exercise routine, Type of exercise: stretching, Time (Minutes): 10, Frequency (Times/Week): >7, Weekly Exercise (Minutes/Week): 0, Intensity: Mild, Exercise limited by: None identified  Diet Patient reports consuming 2 meals a day and 2 snack(s) a day Patient reports that his primary diet is: Regular Patient reports that she does have regular access to food.   Depression Screen PHQ 2/9 Scores 08/06/2020 06/21/2020 11/10/2016 10/07/2015  PHQ - 2 Score 0 0 0 0  PHQ- 9 Score 0 0 - 6     Fall Risk Fall Risk  08/06/2020 06/21/2020 11/10/2016  Falls in the past  year? 0 0 No  Number falls in past yr: 0 0 -  Injury with Fall? 0 0 -  Risk for fall due to : No Fall Risks - -  Follow up Falls evaluation completed - -     Objective:   BP 136/77 (BP Location: Right Arm, Patient Position: Standing, Cuff Size: Normal)   Pulse 87   Ht 6\' 4"  (1.93 m)   Wt 174 lb 1.3 oz (79 kg)   SpO2 100%   BMI 21.19 kg/m   Last Weight  Most recent update: 08/06/2020  1:13 PM    Weight  79 kg (174 lb 1.3 oz)             Body mass index is 21.19 kg/m.  Hearing/Vision  Keshaun did not have difficulty with hearing/understanding during the face-to-face interview Rodriquez did not have difficulty with his vision during the face-to-face interview Reports that he has had a formal eye exam by an eye care professional within the past year Reports that he has not had a formal hearing evaluation within the past year  Cognitive Function: 6CIT Screen 08/06/2020  What Year? 0 points  What month? 0 points  What time? 0 points  Count back from 20 0 points  Months in reverse 2 points  Repeat phrase 0 points  Total Score 2    Normal Cognitive Function Screening: Yes (Normal:0-7, Significant for Dysfunction: >8)  Immunization & Health Maintenance Record Immunization History  Administered Date(s) Administered   Fluad Quad(high Dose 65+) 12/28/2018, 11/27/2019   Influenza Split 01/06/2012, 01/12/2013   Influenza, High Dose Seasonal PF 01/06/2018   Influenza,inj,Quad PF,6+ Mos 10/20/2013   Influenza-Unspecified 11/23/2012, 11/24/2015   PFIZER(Purple Top)SARS-COV-2 Vaccination 04/07/2019, 05/03/2019, 12/23/2019   PNEUMOCOCCAL CONJUGATE-20 08/06/2020   Pneumococcal Polysaccharide-23 01/14/2018   Tdap 11/14/2010, 01/24/2012   Zoster, Live 01/10/2016    Health Maintenance  Topic Date Due   COVID-19 Vaccine (4 - Booster for [place] series) 08/22/2020 (Originally 04/22/2020)   Zoster Vaccines- Shingrix (1 of 2) 11/06/2020 (Originally 08/06/1970)   Hepatitis C Screening   11/09/2020 (Originally 08/05/1969)   INFLUENZA VACCINE  09/23/2020   PNA vac Low Risk Adult (2 of 2 - PCV13) 08/06/2021   TETANUS/TDAP  01/23/2022   Fecal DNA (Cologuard)  07/18/2023   HPV VACCINES  Aged Out       Assessment  This is a routine wellness examination for [employer].  Health Maintenance: Due or Overdue There are no preventive care reminders to display for this patient.   Rueben Bash does not need a referral for Community Assistance: Care Management:   no Social Work:    no Prescription Assistance:  no Nutrition/Diabetes Education:  no   Plan:  Personalized Goals  Goals Addressed               This Visit's Progress     Patient Stated (pt-stated)        08/06/2020 AWV Goal: Exercise for General Health  Patient will verbalize understanding of the benefits of increased physical activity: Exercising regularly is important. It will improve your overall fitness, flexibility, and endurance. Regular exercise also will improve your overall health. It can help you control your weight, reduce stress, and improve your bone density. Over the next year, patient will increase physical activity as tolerated with a goal of at least 150 minutes of moderate physical activity per week.  You can tell that you are exercising at a moderate intensity if your heart starts beating faster and you start breathing faster but can still hold a conversation. Moderate-intensity exercise ideas include: Walking 1 mile (1.6 km) in about 15 minutes Biking Hiking Golfing Dancing Water aerobics Patient will verbalize understanding of everyday activities that increase physical activity by providing examples like the following: Yard work, such as: Sales promotion account executive Gardening Washing windows or floors Patient will be able to explain general safety guidelines for exercising:  Before you start a new exercise  program, talk with  your health care provider. Do not exercise so much that you hurt yourself, feel dizzy, or get very short of breath. Wear comfortable clothes and wear shoes with good support. Drink plenty of water while you exercise to prevent dehydration or heat stroke. Work out until your breathing and your heartbeat get faster.         Personalized Health Maintenance & Screening Recommendations  Pneumococcal vaccine  Shingrix Vaccine  Lung Cancer Screening Recommended: yes (Low Dose CT Chest recommended if Age 11-80 years, 30 pack-year currently smoking OR have quit w/in past 15 years) Hepatitis C Screening recommended: no HIV Screening recommended: no  Advanced Directives: Written information was not given per the patient's request.  Referrals & Orders Orders Placed This Encounter  Procedures   Pneumococcal conjugate vaccine 20-valent (Prevnar 20)     Follow-up Plan Follow-up with Keith Decamp, MD as planned Schedule your shingles vaccine at your pharmacy after discussing it with Dr. Darene Lamer. Let us know if you change your mind about the lung cancer screening after discussing it with Dr. Darene Lamer. Medicare wellness visit in one year. AVS printed for the patient.   I have personally reviewed and noted the following in the patient's chart:   Medical and social history Use of alcohol, tobacco or illicit drugs  Current medications and supplements Functional ability and status Nutritional status Physical activity Advanced directives List of other physicians Hospitalizations, surgeries, and ER visits in previous 12 months Vitals Screenings to include cognitive, depression, and falls Referrals and appointments  In addition, I have reviewed and discussed with patient certain preventive protocols, quality metrics, and best practice recommendations. A written personalized care plan for preventive services as well as general preventive health recommendations were provided to  patient.     Tinnie Gens, RN  08/06/2020

## 2020-08-06 NOTE — Patient Instructions (Signed)
[facility] Maintenance Summary and Written Plan of Care  [name] ,  Thank you for allowing me to perform your Medicare Annual Wellness Visit and for your ongoing commitment to your health.   Health Maintenance & Immunization History Health Maintenance  Topic Date Due   COVID-19 Vaccine (4 - Booster for Pfizer series) 08/22/2020 (Originally 04/22/2020)   Zoster Vaccines- Shingrix (1 of 2) 11/06/2020 (Originally 08/06/1970)   Hepatitis C Screening  11/09/2020 (Originally 08/05/1969)   INFLUENZA VACCINE  09/23/2020   PNA vac Low Risk Adult (2 of 2 - PCV13) 08/06/2021   TETANUS/TDAP  01/23/2022   Fecal DNA (Cologuard)  07/18/2023   HPV VACCINES  Aged Out   Immunization History  Administered Date(s) Administered   Fluad Quad(high Dose 65+) 12/28/2018, 11/27/2019   Influenza Split 01/06/2012, 01/12/2013   Influenza, High Dose Seasonal PF 01/06/2018   Influenza,inj,Quad PF,6+ Mos 10/20/2013   Influenza-Unspecified 11/23/2012, 11/24/2015   PFIZER(Purple Top)SARS-COV-2 Vaccination 04/07/2019, 05/03/2019, 12/23/2019   PNEUMOCOCCAL CONJUGATE-20 08/06/2020   Pneumococcal Polysaccharide-23 01/14/2018   Tdap 11/14/2010, 01/24/2012   Zoster, Live 01/10/2016    These are the patient goals that we discussed:  Goals Addressed               This Visit's Progress     Patient Stated (pt-stated)        08/06/2020 AWV Goal: Exercise for General Health  Patient will verbalize understanding of the benefits of increased physical activity: Exercising regularly is important. It will improve your overall fitness, flexibility, and endurance. Regular exercise also will improve your overall health. It can help you control your weight, reduce stress, and improve your bone density. Over the next year, patient will increase physical activity as tolerated with a goal of at least 150 minutes of moderate physical activity per week.  You can tell that you are exercising at a  moderate intensity if your heart starts beating faster and you start breathing faster but can still hold a conversation. Moderate-intensity exercise ideas include: Walking 1 mile (1.6 km) in about 15 minutes Biking Hiking Golfing Dancing Water aerobics Patient will verbalize understanding of everyday activities that increase physical activity by providing examples like the following: Yard work, such as: Sales promotion account executive Gardening Washing windows or floors Patient will be able to explain general safety guidelines for exercising:  Before you start a new exercise program, talk with your health care provider. Do not exercise so much that you hurt yourself, feel dizzy, or get very short of breath. Wear comfortable clothes and wear shoes with good support. Drink plenty of water while you exercise to prevent dehydration or heat stroke. Work out until your breathing and your heartbeat get faster.           This is a list of Health Maintenance Items that are overdue or due now: Pneumococcal vaccine  Shingrix Vaccine    Orders/Referrals Placed Today: Orders Placed This Encounter  Procedures   Pneumococcal conjugate vaccine 20-valent (Prevnar 20)   (Contact our referral department at [phone] if you have not spoken with someone about your referral appointment within the next 5 days)    Follow-up Plan Follow-up with Silverio Decamp, MD as planned Schedule your shingles vaccine at your pharmacy after discussing it with Dr. Darene Lamer. Let us know if you change your mind about the lung cancer screening after discussing it with Dr. Darene Lamer. Medicare wellness visit  in one year. AVS printed for the patient.  Health Maintenance, Male Adopting a healthy lifestyle and getting preventive care are important in promoting health and wellness. Ask your health care provider about: The right schedule for you to have  regular tests and exams. Things you can do on your own to prevent diseases and keep yourself healthy. What should I know about diet, weight, and exercise? Eat a healthy diet  Eat a diet that includes plenty of vegetables, fruits, low-fat dairy products, and lean protein. Do not eat a lot of foods that are high in solid fats, added sugars, or sodium.  Maintain a healthy weight Body mass index (BMI) is a measurement that can be used to identify possible weight problems. It estimates body fat based on height and weight. Your health care provider can help determine your BMI and help you achieve or maintain ahealthy weight. Get regular exercise Get regular exercise. This is one of the most important things you can do for your health. Most adults should: Exercise for at least 150 minutes each week. The exercise should increase your heart rate and make you sweat (moderate-intensity exercise). Do strengthening exercises at least twice a week. This is in addition to the moderate-intensity exercise. Spend less time sitting. Even light physical activity can be beneficial. Watch cholesterol and blood lipids Have your blood tested for lipids and cholesterol at 69 years of age, then havethis test every 5 years. You may need to have your cholesterol levels checked more often if: Your lipid or cholesterol levels are high. You are older than 69 years of age. You are at high risk for heart disease. What should I know about cancer screening? Many types of cancers can be detected early and may often be prevented. Depending on your health history and family history, you may need to have cancer screening at various ages. This may include screening for: Colorectal cancer. Prostate cancer. Skin cancer. Lung cancer. What should I know about heart disease, diabetes, and high blood pressure? Blood pressure and heart disease High blood pressure causes heart disease and increases the risk of stroke. This is more  likely to develop in people who have high blood pressure readings, are of African descent, or are overweight. Talk with your health care provider about your target blood pressure readings. Have your blood pressure checked: Every 3-5 years if you are 4-22 years of age. Every year if you are 54 years old or older. If you are between the ages of 53 and 75 and are a current or former smoker, ask your health care provider if you should have a one-time screening for abdominal aortic aneurysm (AAA). Diabetes Have regular diabetes screenings. This checks your fasting blood sugar level. Have the screening done: Once every three years after age 41 if you are at a normal weight and have a low risk for diabetes. More often and at a younger age if you are overweight or have a high risk for diabetes. What should I know about preventing infection? Hepatitis B If you have a higher risk for hepatitis B, you should be screened for this virus. Talk with your health care provider to find out if you are at risk forhepatitis B infection. Hepatitis C Blood testing is recommended for: Everyone born from [birth year] through 1965. Anyone with known risk factors for hepatitis C. Sexually transmitted infections (STIs) You should be screened each year for STIs, including gonorrhea and chlamydia, if: You are sexually active and are younger than 69 years  of age. You are older than 69 years of age and your health care provider tells you that you are at risk for this type of infection. Your sexual activity has changed since you were last screened, and you are at increased risk for chlamydia or gonorrhea. Ask your health care provider if you are at risk. Ask your health care provider about whether you are at high risk for HIV. Your health care provider may recommend a prescription medicine to help prevent HIV infection. If you choose to take medicine to prevent HIV, you should first get tested for HIV. You should then be tested every  3 months for as long as you are taking the medicine. Follow these instructions at home: Lifestyle Do not use any products that contain nicotine or tobacco, such as cigarettes, e-cigarettes, and chewing tobacco. If you need help quitting, ask your health care provider. Do not use street drugs. Do not share needles. Ask your health care provider for help if you need support or information about quitting drugs. Alcohol use Do not drink alcohol if your health care provider tells you not to drink. If you drink alcohol: Limit how much you have to 0-2 drinks a day. Be aware of how much alcohol is in your drink. In the [place] one drink equals one 12 oz bottle of beer (355 mL), one 5 oz glass of wine (148 mL), or one 1 oz glass of hard liquor (44 mL). General instructions Schedule regular health, dental, and eye exams. Stay current with your vaccines. Tell your health care provider if: You often feel depressed. You have ever been abused or do not feel safe at home. Summary Adopting a healthy lifestyle and getting preventive care are important in promoting health and wellness. Follow your health care provider's instructions about healthy diet, exercising, and getting tested or screened for diseases. Follow your health care provider's instructions on monitoring your cholesterol and blood pressure. This information is not intended to replace advice given to you by your health care provider. Make sure you discuss any questions you have with your healthcare provider. Document Revised: 02/02/2018 Document Reviewed: 02/02/2018 Elsevier Patient Education  2022 [employer].

## 2020-08-07 ENCOUNTER — Ambulatory Visit: Payer: Medicare Other

## 2020-08-09 ENCOUNTER — Telehealth: Payer: Self-pay | Admitting: Sports Medicine

## 2020-08-09 NOTE — Progress Notes (Signed)
  Chronic Care Management   Outreach Note  08/09/2020 Name: Thelmer Legler MRN: [id number] DOB: 03-16-1950  Referred by: Silverio Decamp, MD Reason for referral : No chief complaint on file.   An unsuccessful telephone outreach was attempted today. The patient was referred to the pharmacist for assistance with care management and care coordination.   Follow Up Plan:   Lauretta Grill Upstream Scheduler

## 2020-08-23 ENCOUNTER — Telehealth: Payer: Self-pay | Admitting: Sports Medicine

## 2020-08-23 NOTE — Progress Notes (Signed)
  Chronic Care Management   Outreach Note  08/23/2020 Name: Keith Moses MRN: [id number] DOB: 08-Nov-1950  Referred by: Silverio Decamp, MD Reason for referral : No chief complaint on file.   An unsuccessful telephone outreach was attempted today. The patient was referred to the pharmacist for assistance with care management and care coordination.   Follow Up Plan:   Lauretta Grill Upstream Scheduler

## 2020-09-03 ENCOUNTER — Telehealth: Payer: Self-pay | Admitting: Sports Medicine

## 2020-09-03 NOTE — Chronic Care Management (AMB) (Signed)
  Chronic Care Management   Outreach Note  09/03/2020 Name: Keith Moses MRN: [id number] DOB: 19-Oct-1950  Referred by: Silverio Decamp, MD Reason for referral : No chief complaint on file.   A second unsuccessful telephone outreach was attempted today. The patient was referred to pharmacist for assistance with care management and care coordination.  Follow Up Plan:   Lauretta Grill Upstream Scheduler

## 2020-09-04 ENCOUNTER — Telehealth: Payer: Self-pay | Admitting: Sports Medicine

## 2020-09-04 NOTE — Chronic Care Management (AMB) (Signed)
  Chronic Care Management   Outreach Note  09/04/2020 Name: Keith Moses MRN: [id number] DOB: 1950-07-26  Referred by: Silverio Decamp, MD Reason for referral : No chief complaint on file.   A second unsuccessful telephone outreach was attempted today. The patient was referred to pharmacist for assistance with care management and care coordination.  Follow Up Plan:   Lauretta Grill Upstream Scheduler

## 2020-09-04 NOTE — Chronic Care Management (AMB) (Signed)
  Chronic Care Management   Note  09/04/2020 Name: Jathen Sudano MRN: [id number] DOB: June 17, 1950  Traci Plemons is a 70 y.o. year old male who is a primary care patient of Dianah Field, [name], MD. I reached out to Rueben Bash by phone today in response to a referral sent by Mr. Percell Miller Fullen's PCP, Silverio Decamp, MD.   Mr. Mcmichen was given information about Chronic Care Management services today including:  CCM service includes personalized support from designated clinical staff supervised by his physician, including individualized plan of care and coordination with other care providers 24/7 contact phone numbers for assistance for urgent and routine care needs. Service will only be billed when office clinical staff spend 20 minutes or more in a month to coordinate care. Only one practitioner may furnish and bill the service in a calendar month. The patient may stop CCM services at any time (effective at the end of the month) by phone call to the office staff.   Patient agreed to services and verbal consent obtained.   Follow up plan:   Lauretta Grill Upstream Scheduler

## 2020-09-08 ENCOUNTER — Other Ambulatory Visit: Payer: Self-pay | Admitting: Sports Medicine

## 2020-09-11 ENCOUNTER — Other Ambulatory Visit: Payer: Self-pay | Admitting: Sports Medicine

## 2020-09-11 DIAGNOSIS — M5416 Radiculopathy, lumbar region: Secondary | ICD-10-CM

## 2020-09-19 ENCOUNTER — Other Ambulatory Visit: Payer: Self-pay | Admitting: Sports Medicine

## 2020-09-19 DIAGNOSIS — F411 Generalized anxiety disorder: Secondary | ICD-10-CM

## 2020-10-07 ENCOUNTER — Telehealth: Payer: Self-pay | Admitting: Pharmacist

## 2020-10-07 NOTE — Chronic Care Management (AMB) (Signed)
    Chronic Care Management Pharmacy Assistant   Name: [name]  MRN: [id number] DOB: 02-11-1951  Keith Moses is an 70 y.o. year old male who presents for his initial CCM visit with the clinical pharmacist.  Recent office visits:  08/06/20-Thomas J. Thekkekandam (PCP) Medicare Wellness. Follow up in 1 year. 07/25/20-Thomas J. Thekkekandam (PCP) Seen for blod pressure check. Decreasing valsartan 40 mg. Follow up in 2 weeks.  07/11/20-Taylor B. Olevia Bowens, NP. Seen for dizziness. Follow up in 2 weeks. 07/08/20-Thomas J. Thekkekandam (PCP) Seen for Hypertension. 06/21/20-Thomas J. Thekkekandam (PCP) Screening for colon cancer. Follow up in 4-6 weeks. Follow up nurse visit in 2 weeks.  Recent consult visits:  05/16/20-Patrick Hageman (Ophthalmology) Notes not available. 05/10/20-Patrick Hageman (Ophthalmology) Notes not available. 05/09/20-Patrick Hageman (Ophthalmology) Notes not available. 05/02/20-Kirk Murdick (Ophthalmology) Notes not available.  Hospital visits:  None in previous 6 months  Medications: Outpatient Encounter Medications as of 10/07/2020  Medication Sig   aspirin 81 MG tablet Take 81 mg by mouth daily.   atorvastatin (LIPITOR) 20 MG tablet TAKE 1 TABLET BY MOUTH  DAILY   B Complex Vitamins (B COMPLEX 50) TABS Take 1 tablet by mouth daily.   Cholecalciferol 25 MCG (1000 UT) tablet Take by mouth.   gabapentin (NEURONTIN) 300 MG capsule TAKE 1 CAPSULE BY MOUTH  DAILY AS NEEDED   meloxicam (MOBIC) 15 MG tablet TAKE 1/2 TO 1 TABLET BY  MOUTH WITH DINNER   Multiple Vitamins-Minerals (MULTIVITAMIN ADULTS 50+) TABS Take 1 tablet by mouth daily.   sertraline (ZOLOFT) 25 MG tablet TAKE 1 TABLET BY MOUTH  DAILY   tamsulosin (FLOMAX) 0.4 MG CAPS capsule Take 1 capsule (0.4 mg total) by mouth daily after breakfast.   valsartan (DIOVAN) 40 MG tablet Take 1 tablet (40 mg total) by mouth daily.   No facility-administered encounter medications on file as of 10/07/2020.   Aspirin  81 MG tablet Last filled:None noted Atorvastatin (LIPITOR) 20 MG tablet Last filled:09/09/20 90 DS B Complex Vitamins (B COMPLEX 50) TABS Last filled:None noted Cholecalciferol 25 MCG (1000 UT) tablet Last filled:None noted Gabapentin (NEURONTIN) 300 MG capsule Last filled:07/17/20 90 DS Meloxicam (MOBIC) 15 MG tablet Last filled:09/01/20 90 DS Sertraline (ZOLOFT) 25 MG tablet Last filled:07/25/20 90 DS Tamsulosin (FLOMAX) 0.4 MG CAPS capsule Last filled:09/20/20 90 DS Valsartan (DIOVAN) 40 MG tablet Last filled:09/17/20 90 DS   Star Rating Drugs: Valsartan (DIOVAN) 40 MG tablet Last filled:09/17/20 90 DS Atorvastatin (LIPITOR) 20 MG tablet Last filled:09/09/20 90 DS  Myriam Elta [place], [place]

## 2020-10-18 ENCOUNTER — Ambulatory Visit (INDEPENDENT_AMBULATORY_CARE_PROVIDER_SITE_OTHER): Payer: Medicare Other | Admitting: Pharmacist

## 2020-10-18 DIAGNOSIS — E785 Hyperlipidemia, unspecified: Secondary | ICD-10-CM

## 2020-10-18 DIAGNOSIS — I10 Essential (primary) hypertension: Secondary | ICD-10-CM

## 2020-10-18 NOTE — Progress Notes (Signed)
Chronic Care Management Pharmacy Note  10/18/2020 Name:  Keith Moses MRN:  [id number] DOB:  08-Aug-1950  Summary: addressed HTN, HLD, tobacco use  Recommendations/Changes made from today's visit: none  Plan: patient to follow up with PCP for any ongoing needs  Subjective: Keith Moses is an 70 y.o. year old male who is a primary patient of Thekkekandam, [name], MD.  The CCM team was consulted for assistance with disease management and care coordination needs.    Engaged with patient by telephone for initial visit in response to provider referral for pharmacy case management and/or care coordination services.   Consent to Services:  The patient was given information about Chronic Care Management services, agreed to services, and gave verbal consent prior to initiation of services.  Please see initial visit note for detailed documentation.   Patient Care Team: Silverio Decamp, MD as PCP - General (Sports Medicine) Darius Bump, [facility] as Pharmacist (Pharmacist)  Recent office visits:  08/06/20-Thomas J. Thekkekandam (PCP) Medicare Wellness. Follow up in 1 year. 07/25/20-Thomas J. Thekkekandam (PCP) Seen for blod pressure check. Decreasing valsartan 40 mg. Follow up in 2 weeks.  07/11/20-Taylor B. Olevia Bowens, NP. Seen for dizziness. Follow up in 2 weeks. 07/08/20-Thomas J. Thekkekandam (PCP) Seen for Hypertension. 06/21/20-Thomas J. Thekkekandam (PCP) Screening for colon cancer. Follow up in 4-6 weeks. Follow up nurse visit in 2 weeks.   Recent consult visits:  05/16/20-Patrick Hageman (Ophthalmology) Notes not available. 05/10/20-Patrick Hageman (Ophthalmology) Notes not available. 05/09/20-Patrick Hageman (Ophthalmology) Notes not available. 05/02/20-Kirk Murdick (Ophthalmology) Notes not available.   Hospital visits:  None in previous 6 months  Objective:  Lab Results  Component Value Date   CREATININE 0.98 10/02/2019   CREATININE 0.87 01/07/2018   CREATININE 0.73  11/10/2016    Lab Results  Component Value Date   HGBA1C 5.2 01/09/2016   Last diabetic Eye exam: No results found for: HMDIABEYEEXA  Last diabetic Foot exam: No results found for: HMDIABFOOTEX      Component Value Date/Time   CHOL 186 10/02/2019 0935   TRIG 47 10/02/2019 0935   HDL 72 10/02/2019 0935   CHOLHDL 2.6 10/02/2019 0935   VLDL 10 01/09/2016 0821   LDLCALC 100 (H) 10/02/2019 0935    Hepatic Function Latest Ref Rng & Units 10/02/2019 01/07/2018 11/10/2016  Total Protein 6.1 - 8.1 g/dL 6.4 6.5 6.9  Albumin 3.6 - 5.1 g/dL - - -  AST 10 - 35 U/L 20 20 17   ALT 9 - 46 U/L 14 11 15   Alk Phosphatase 25 - 125 - - -  Total Bilirubin 0.2 - 1.2 mg/dL 0.6 0.5 0.8    Lab Results  Component Value Date/Time   TSH 1.89 10/02/2019 09:35 AM   TSH 2.31 01/07/2018 08:57 AM    CBC Latest Ref Rng & Units 10/02/2019 01/07/2018 11/10/2016  WBC 3.8 - 10.8 Thousand/uL 6.5 8.2 9.8  Hemoglobin 13.2 - 17.1 g/dL 14.1 13.8 14.5  Hematocrit 38.5 - 50.0 % 41.5 40.1 41.9  Platelets 140 - 400 Thousand/uL 264 247 279    Lab Results  Component Value Date/Time   VD25OH 25 (L) 01/09/2016 08:21 AM    Clinical ASCVD: Yes  The 10-year ASCVD risk score Mikey Bussing DC Jr., et al., 2013) is: 21.8%   Values used to calculate the score:     Age: 88 years     Sex: Male     Is Non-Hispanic African American: No     Diabetic: No     Tobacco  smoker: Yes     Systolic Blood Pressure: 675 mmHg     Is BP treated: Yes     HDL Cholesterol: 72 mg/dL     Total Cholesterol: 186 mg/dL    Other: (CHADS2VASc if Afib, PHQ9 if depression, MMRC or CAT for COPD, ACT, DEXA)  Social History   Tobacco Use  Smoking Status Every Day   Packs/day: 0.50   Years: 35.00   Pack years: 17.50   Types: Cigarettes  Smokeless Tobacco Never   BP Readings from Last 3 Encounters:  08/06/20 136/77  07/25/20 107/63  07/11/20 115/65   Pulse Readings from Last 3 Encounters:  08/06/20 87  07/25/20 76  07/11/20 99   Wt Readings  from Last 3 Encounters:  08/06/20 174 lb 1.3 oz (79 kg)  07/25/20 175 lb (79.4 kg)  07/11/20 175 lb 8 oz (79.6 kg)    Assessment: Review of patient past medical history, allergies, medications, health status, including review of consultants reports, laboratory and other test data, was performed as part of comprehensive evaluation and provision of chronic care management services.   SDOH:  (Social Determinants of Health) assessments and interventions performed:    CCM Care Plan  Allergies  Allergen Reactions   Doxycycline Nausea And Vomiting    Medications Reviewed Today     Reviewed by Tinnie Gens, RN (Registered Nurse) on 08/06/20 at 1314  Med List Status: <None>   Medication Order Taking? Sig Documenting Provider Last Dose Status Informant  aspirin 81 MG tablet [id number] Yes Take 81 mg by mouth daily. [provider] Taking Active   atorvastatin (LIPITOR) 20 MG tablet [id number] Yes TAKE 1 TABLET BY MOUTH  DAILY Silverio Decamp, MD Taking Active   gabapentin (NEURONTIN) 300 MG capsule [id number] Yes Take 1 capsule (300 mg total) by mouth daily as needed. Silverio Decamp, MD Taking Active   meloxicam [facility]) 15 MG tablet [id number] Yes TAKE 1/2 TO 1 TABLET BY  MOUTH WITH DINNER Silverio Decamp, MD Taking Active   sertraline (ZOLOFT) 25 MG tablet [id number] Yes Take 1 tablet (25 mg total) by mouth daily. Silverio Decamp, MD Taking Active   tamsulosin [facility]) 0.4 MG CAPS capsule [id number] Yes Take 1 capsule (0.4 mg total) by mouth daily after breakfast. Silverio Decamp, MD Taking Active   valsartan (DIOVAN) 40 MG tablet [id number] Yes Take 1 tablet (40 mg total) by mouth daily. Silverio Decamp, MD Taking Active             Patient Active Problem List   Diagnosis Date Noted   Impingement syndrome, shoulder, left 06/21/2020   Radiculitis of left cervical region 06/21/2020   Prostatitis 10/24/2019   Right lumbar radiculitis  08/03/2019   Primary osteoarthritis of both hands 01/13/2018   Venous stasis dermatitis of both lower extremities 01/13/2018   Primary osteoarthritis of right foot 10/07/2015   Back pain 10/09/2014   Psoriasis 02/02/2014   Annual physical exam 10/20/2013   Hyperlipidemia 10/20/2013   Essential hypertension, benign 10/20/2013   Smoker 10/20/2013   Generalized anxiety disorder 03/24/2013   Plantar fasciitis, right 12/20/2012   Onychomycosis 12/20/2012    Immunization History  Administered Date(s) Administered   Fluad Quad(high Dose 65+) 12/28/2018, 11/27/2019   Influenza Split 01/06/2012, 01/12/2013   Influenza, High Dose Seasonal PF 01/06/2018   Influenza,inj,Quad PF,6+ Mos 10/20/2013   Influenza-Unspecified 11/23/2012, 11/24/2015   PFIZER(Purple Top)SARS-COV-2 Vaccination 04/07/2019, 05/03/2019, 12/23/2019   PNEUMOCOCCAL CONJUGATE-20 08/06/2020   Pneumococcal Polysaccharide-23 01/14/2018  Tdap 11/14/2010, 01/24/2012   Zoster, Live 01/10/2016    Conditions to be addressed/monitored: HTN, HLD, and tobacco use  There are no care plans that you recently modified to display for this patient.   Medication Assistance: None required.  Patient affirms current coverage meets needs.  Patient's preferred pharmacy is:  [id number] [place] [place] [id number]: [phone]: [phone] [facility]  ([place] [facility] [place]Phone: [phone]: [phone] Uses pill box? Yes Pt endorses 100% compliance  Follow Up:  Patient requests no follow-up at this time.  Plan: The patient has been provided with contact information for the care management team and has been advised to call with any health related questions or concerns.   KDarius Bump

## 2020-10-18 NOTE — Patient Instructions (Signed)
Visit Information   PATIENT GOALS:   Goals Addressed             This Visit's Progress    Medication Management       Patient Goals/Self-Care Activities Over the next 365 days, patient will:  take medications as prescribed  Follow Up Plan: The patient has been provided with contact information for the care management team and has been advised to call with any health related questions or concerns.          Consent to CCM Services: Keith Moses was given information about Chronic Care Management services including:  CCM service includes personalized support from designated clinical staff supervised by his physician, including individualized plan of care and coordination with other care providers 24/7 contact phone numbers for assistance for urgent and routine care needs. Service will only be billed when office clinical staff spend 20 minutes or more in a month to coordinate care. Only one practitioner may furnish and bill the service in a calendar month. The patient may stop CCM services at any time (effective at the end of the month) by phone call to the office staff. The patient will be responsible for cost sharing (co-pay) of up to 20% of the service fee (after annual deductible is met).  Patient agreed to services and verbal consent obtained.   Patient verbalizes understanding of instructions provided today and agrees to view in [place].   The patient has been provided with contact information for the care management team and has been advised to call with any health related questions or concerns.    CLINICAL CARE PLAN: Patient Care Plan: Medication Management     Problem Identified: HTN, HLD, Tobacco use      Long-Range Goal: Disease Progression Prevention Completed 10/18/2020  Start Date: 10/18/2020  This Visit's Progress: On track  Priority: High  Note:   Current Barriers:  None at present  Pharmacist Clinical Goal(s):  Over the next 365 days, patient will contact  provider office for questions/concerns as evidenced notation of same in electronic health record through collaboration with PharmD and provider.   Interventions: 1:1 collaboration with Silverio Decamp, MD regarding development and update of comprehensive plan of care as evidenced by provider attestation and co-signature Inter-disciplinary care team collaboration (see longitudinal plan of care) Comprehensive medication review performed; medication list updated in electronic medical record  Hypertension:  Uncontrolled/controlled; current treatment:valsartan 90m daily;   Current home readings:   Denies hypotensive/hypertensive symptoms  Educated on BP goals, ideal numbers, and to contact office if he sees frequent numbers in SBP 150s. Recommended continue current regimen, patient states "if he sees his numbers off, he is going to adjust the dose on his own",  Hyperlipidemia:  Controlled; current treatment:atorvastatin 269mdaily; LDL 100  Recommended continue current regimen Tobacco Abuse:  Previous quit attempts: successful using medication in past prescribed by PCP, could not recall medication.  Motivation to quit smoking: "retired and enjoying his life"  Educated on various methods available via medications and/or nicotine replacement therapy,   Patient Goals/Self-Care Activities Over the next 365 days, patient will:  take medications as prescribed  Follow Up Plan: The patient has been provided with contact information for the care management team and has been advised to call with any health related questions or concerns.      Long-Range Goal: Disease Progression Prevention   Start Date: 10/18/2020  This Visit's Progress: On track  Priority: High  Note:   Current Barriers:  None  at present  Pharmacist Clinical Goal(s):  Over the next 365 days, patient will contact provider office for questions/concerns as evidenced notation of same in electronic health record through  collaboration with PharmD and provider.   Interventions: 1:1 collaboration with Silverio Decamp, MD regarding development and update of comprehensive plan of care as evidenced by provider attestation and co-signature Inter-disciplinary care team collaboration (see longitudinal plan of care) Comprehensive medication review performed; medication list updated in electronic medical record  Hypertension:  Uncontrolled/controlled; current treatment:valsartan 40m daily;   Current home readings:   Denies hypotensive/hypertensive symptoms  Educated on BP goals, ideal numbers, and to contact office if he sees frequent numbers in SBP 150s. Recommended continue current regimen, patient states "if he sees his numbers off, he is going to adjust the dose on his own",  Hyperlipidemia:  Controlled; current treatment:atorvastatin 252mdaily; LDL 100  Recommended continue current regimen Tobacco Abuse:  Previous quit attempts: successful using medication in past prescribed by PCP, could not recall medication.  Motivation to quit smoking: "retired and enjoying his life"  Educated on various methods available via medications and/or nicotine replacement therapy,   Patient Goals/Self-Care Activities Over the next 365 days, patient will:  take medications as prescribed  Follow Up Plan: The patient has been provided with contact information for the care management team and has been advised to call with any health related questions or concerns.

## 2020-10-26 ENCOUNTER — Other Ambulatory Visit: Payer: Self-pay | Admitting: Sports Medicine

## 2020-10-26 DIAGNOSIS — M19042 Primary osteoarthritis, left hand: Secondary | ICD-10-CM

## 2020-10-26 DIAGNOSIS — M19041 Primary osteoarthritis, right hand: Secondary | ICD-10-CM

## 2020-10-28 ENCOUNTER — Other Ambulatory Visit: Payer: Self-pay | Admitting: Sports Medicine

## 2020-10-28 DIAGNOSIS — N410 Acute prostatitis: Secondary | ICD-10-CM

## 2020-11-06 IMAGING — DX DG LUMBAR SPINE COMPLETE 4+V
6 series · 6 of 6 positions shown · non-contrast
Comparison: None.

CLINICAL DATA: Right lumbar radiculitis

EXAM:
LUMBAR SPINE - COMPLETE 4+ VIEW

[l-spine ap]
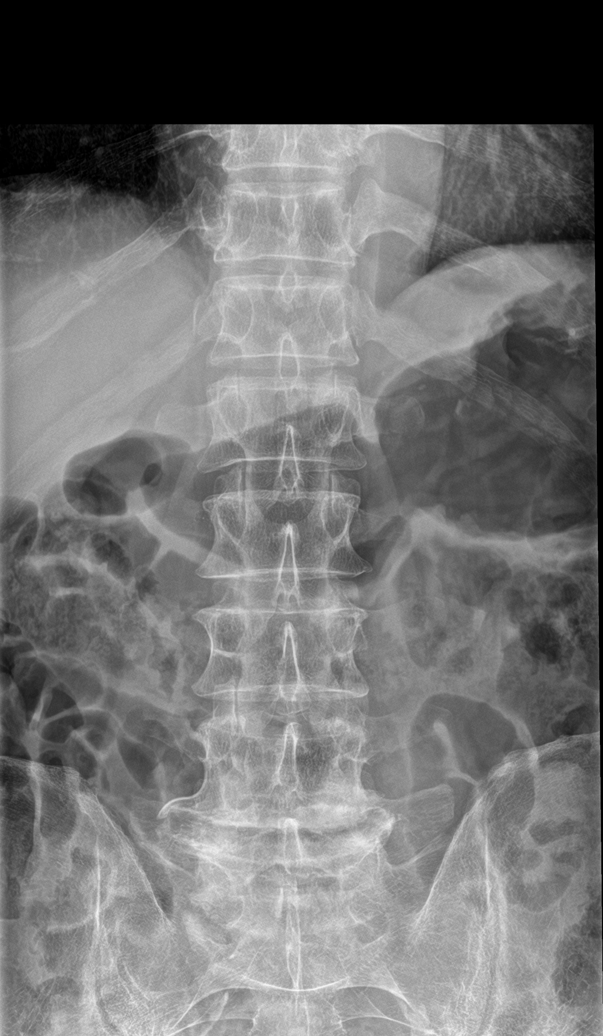

[l-spine obl (1 of 3)]
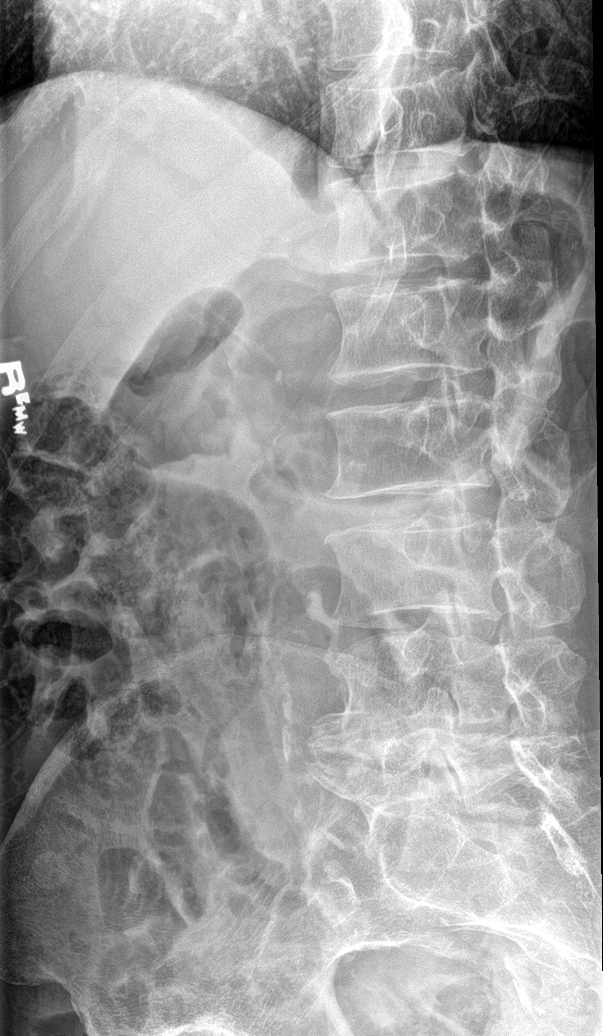

[l-spine obl (2 of 3)]
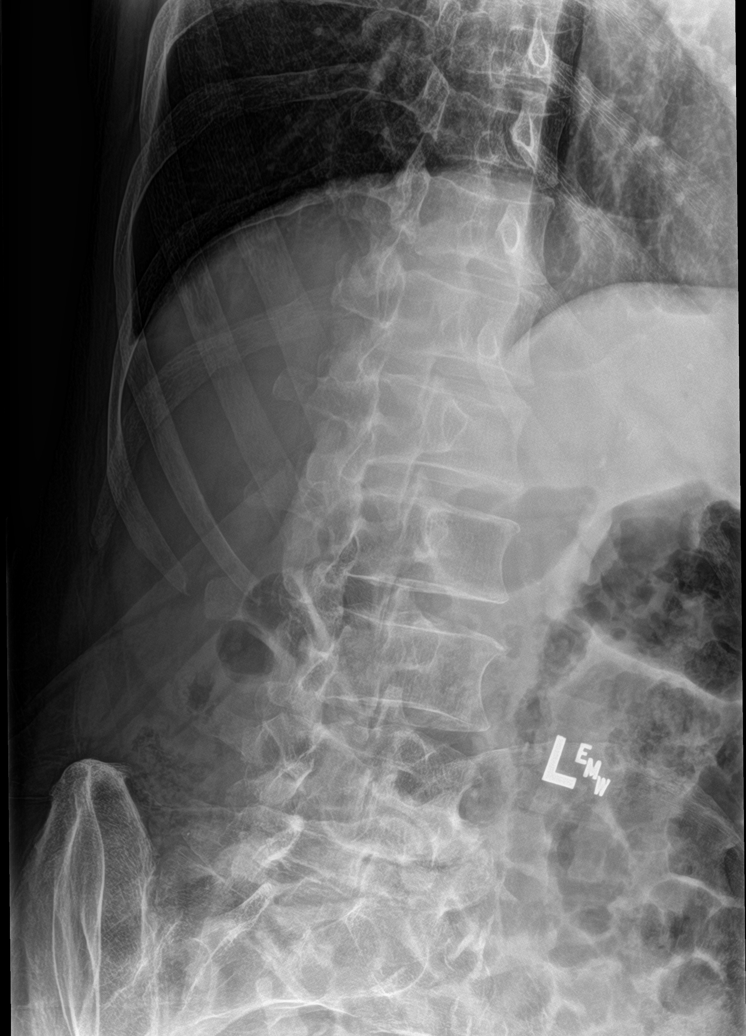

[l-spine lat]
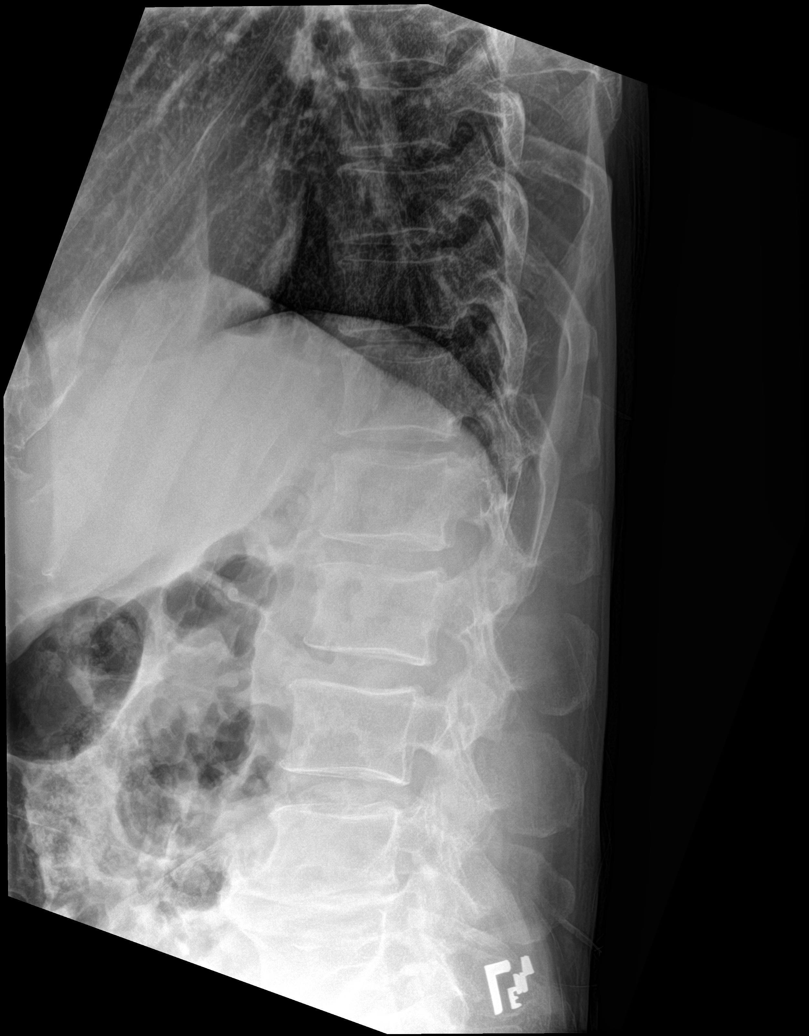

[l-spine spot]
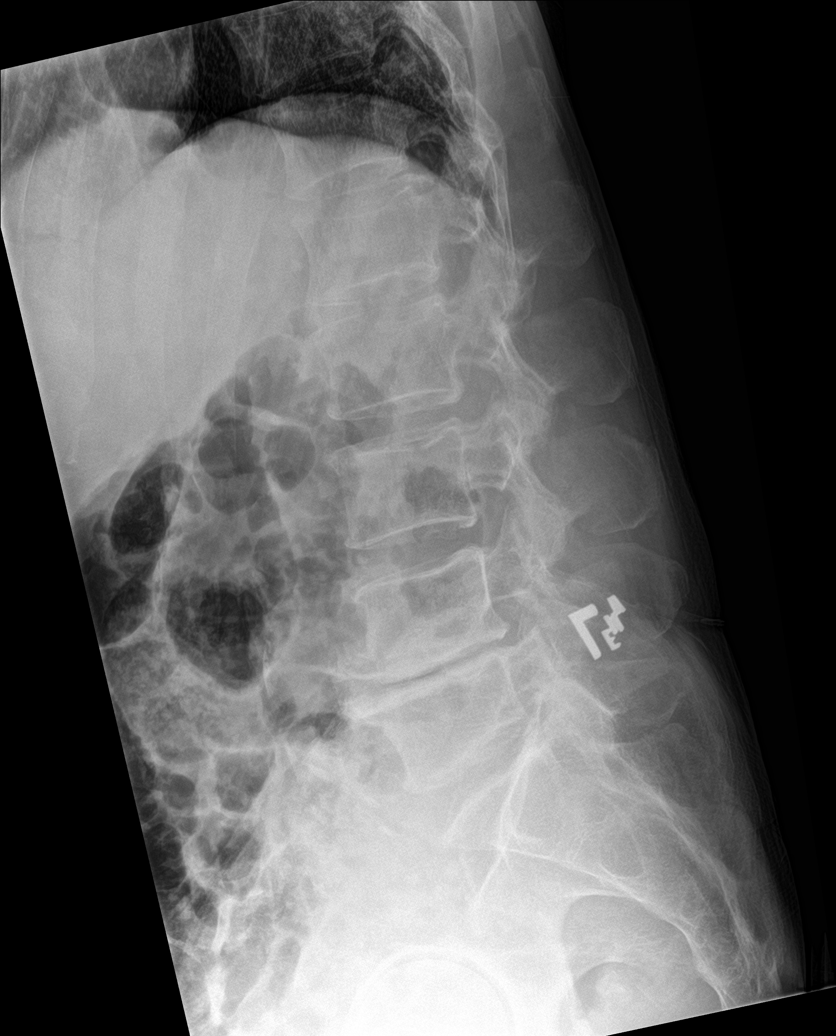

[l-spine obl (3 of 3)]
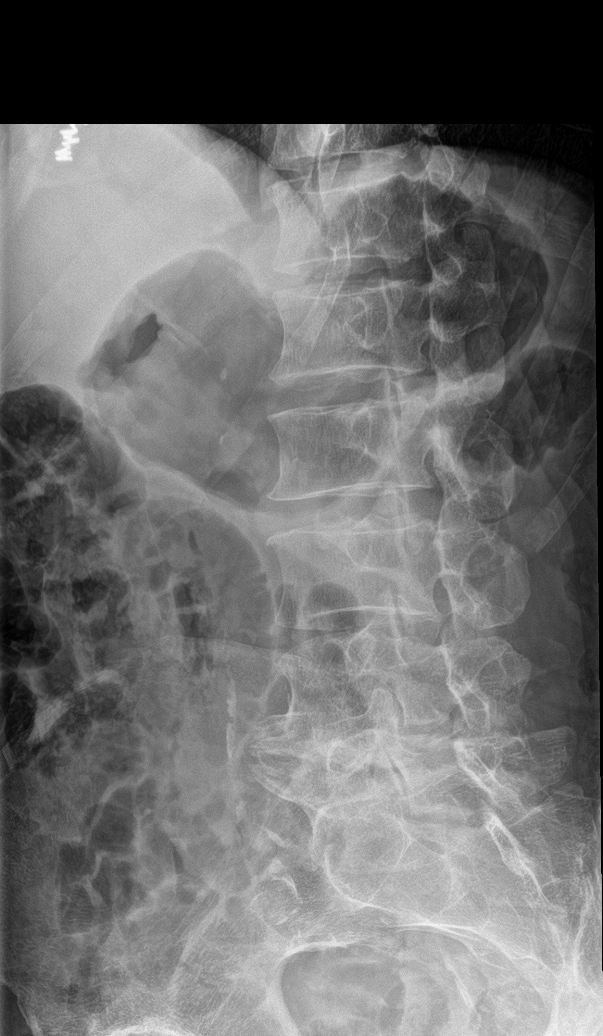

[6 of 6 positions shown; findings below may reference images not displayed]

FINDINGS: Degenerative disc disease with disc space narrowing, vacuum disc and
spurring at L4-5. Degenerative facet disease diffusely throughout
the lumbar spine. No fracture or malalignment.
IMPRESSION: Degenerative disc and facet disease.  No acute bony abnormality.

## 2020-12-02 DIAGNOSIS — H26492 Other secondary cataract, left eye: Secondary | ICD-10-CM | POA: Diagnosis not present

## 2021-01-19 ENCOUNTER — Other Ambulatory Visit: Payer: Self-pay | Admitting: Sports Medicine

## 2021-01-19 DIAGNOSIS — M19041 Primary osteoarthritis, right hand: Secondary | ICD-10-CM

## 2021-02-07 ENCOUNTER — Ambulatory Visit (INDEPENDENT_AMBULATORY_CARE_PROVIDER_SITE_OTHER): Payer: Medicare Other | Admitting: Family Medicine

## 2021-02-07 ENCOUNTER — Encounter: Payer: Self-pay | Admitting: Family Medicine

## 2021-02-07 ENCOUNTER — Other Ambulatory Visit: Payer: Self-pay

## 2021-02-07 VITALS — BP 144/74 | HR 70 | Temp 98.0°F | Ht 76.0 in | Wt 172.0 lb

## 2021-02-07 DIAGNOSIS — M545 Low back pain, unspecified: Secondary | ICD-10-CM | POA: Diagnosis not present

## 2021-02-07 MED ORDER — PREDNISONE 50 MG PO TABS: ORAL_TABLET | ORAL | 0 refills | Status: DC

## 2021-02-07 NOTE — Progress Notes (Signed)
Acute Office Visit  Subjective:    Patient ID: Keith Moses, male    DOB: [birth year], 70 y.o.   MRN: [id number]  Chief Complaint  Patient presents with   Back Pain    Has history of DDD.     HPI Patient is in today for low back pain.   Patient reports history of low back pain with degenerative disk and facet disease at L4-L5 per last images in June 2021. States he will have occasional flares, but can usually manage well at home. A little over a week ago he was at the lumber yard and recalls lifting some heavy lumber with poor body mechanics. States he was not hurt at the time, but by the next day his lower back was very sore. He denies any loss of function, incontinence, numbness, tingling, radiation of pain. States the pain is very annoying/aching, worse with position changes. He feels better leaning forwards. In the past, a burst of prednisone has helped similar pain and he would like to try that again. Reports he has been doing home exercises and using a heating pad. He takes meloxicam regularly for chronic pain.      Past Medical History:  Diagnosis Date   Hyperlipidemia    Hypertension     Past Surgical History:  Procedure Laterality Date   HERNIA REPAIR      Family History  Problem Relation Age of Onset   Hypertension Mother    Cancer Mother    Cancer Father        prostate and stomach CA    Social History   Socioeconomic History   Marital status: Married    Spouse name: Nickalous Stingley   Number of children: 0   Years of education: 13   Highest education level: Some college, no degree  Occupational History    Comment: Retired  Tobacco Use   Smoking status: Every Day    Packs/day: 0.50    Years: 35.00    Pack years: 17.50    Types: Cigarettes   Smokeless tobacco: Never  Substance and Sexual Activity   Alcohol use: Yes    Alcohol/week: 3.0 - 4.0 standard drinks    Types: 3 - 4 Cans of beer per week    Comment: 3-4 beers daily   Drug use: No   Sexual  activity: Not on file  Other Topics Concern   Not on file  Social History Narrative   Lives with wife and his brother in [place]. Enjoys woodworking, gardening and painting.   Social Determinants of Health   Financial Resource Strain: Low Risk    Difficulty of Paying Living Expenses: Not hard at all  Food Insecurity: No Food Insecurity   Worried About Charity fundraiser in the Last Year: Never true   [place] in the Last Year: Never true  Transportation Needs: No Transportation Needs   Lack of Transportation (Medical): No   Lack of Transportation (Non-Medical): No  Physical Activity: Inactive   Days of Exercise per Week: 0 days   Minutes of Exercise per Session: 0 min  Stress: No Stress Concern Present   Feeling of Stress : Not at all  Social Connections: Moderately Integrated   Frequency of Communication with Friends and Family: Twice a week   Frequency of Social Gatherings with Friends and Family: Once a week   Attends Religious Services: 1 to 4 times per year   Active Member of Clubs or Organizations: No   Attends  Archivist Meetings: Never   Marital Status: Married  Human resources officer Violence: Not At Risk   Fear of Current or Ex-Partner: No   Emotionally Abused: No   Physically Abused: No   Sexually Abused: No    Outpatient Medications Prior to Visit  Medication Sig Dispense Refill   aspirin 81 MG tablet Take 81 mg by mouth daily.     atorvastatin (LIPITOR) 20 MG tablet TAKE 1 TABLET BY MOUTH  DAILY 90 tablet 3   Cholecalciferol 25 MCG (1000 UT) tablet Take by mouth.     gabapentin (NEURONTIN) 300 MG capsule TAKE 1 CAPSULE BY MOUTH  DAILY AS NEEDED 90 capsule 3   meloxicam (MOBIC) 15 MG tablet TAKE 1/2 TO 1 TABLET BY  MOUTH WITH DINNER 90 tablet 3   Multiple Vitamins-Minerals (MULTIVITAMIN ADULTS 50+) TABS Take 1 tablet by mouth daily.     sertraline (ZOLOFT) 25 MG tablet TAKE 1 TABLET BY MOUTH  DAILY 90 tablet 3   tamsulosin (FLOMAX) 0.4 MG CAPS capsule  TAKE 1 CAPSULE BY MOUTH  DAILY AFTER BREAKFAST. 90 capsule 3   valsartan (DIOVAN) 40 MG tablet Take 1 tablet (40 mg total) by mouth daily. 30 tablet 11   No facility-administered medications prior to visit.    Allergies  Allergen Reactions   Doxycycline Nausea And Vomiting    Review of Systems All review of systems negative except what is listed in the HPI     Objective:    Physical Exam Vitals reviewed.  HENT:     Head: Normocephalic and atraumatic.  Musculoskeletal:        General: Normal range of motion.     Cervical back: Normal range of motion and neck supple.     Comments: Lower back with tense paraspinal muscles, no pain over bony prominences   Skin:    General: Skin is warm and dry.  Neurological:     General: No focal deficit present.     Mental Status: He is alert and oriented to person, place, and time. Mental status is at baseline.  Psychiatric:        Mood and Affect: Mood normal.        Behavior: Behavior normal.        Thought Content: Thought content normal.        Judgment: Judgment normal.       BP (!) 144/74 (BP Location: Left Arm, Patient Position: Sitting, Cuff Size: Normal)    Pulse 70    Temp 98 F (36.7 C) (Oral)    Ht 6\' 4"  (1.93 m)    Wt 172 lb 0.6 oz (78 kg)    SpO2 99%    BMI 20.94 kg/m  Wt Readings from Last 3 Encounters:  02/07/21 172 lb 0.6 oz (78 kg)  08/06/20 174 lb 1.3 oz (79 kg)  07/25/20 175 lb (79.4 kg)    Health Maintenance Due  Topic Date Due   Hepatitis C Screening  Never done    There are no preventive care reminders to display for this patient.   Lab Results  Component Value Date   TSH 1.89 10/02/2019   Lab Results  Component Value Date   WBC 6.5 10/02/2019   HGB 14.1 10/02/2019   HCT 41.5 10/02/2019   MCV 95.4 10/02/2019   PLT 264 10/02/2019   Lab Results  Component Value Date   NA 139 10/02/2019   K 4.0 10/02/2019   CO2 24 10/02/2019   GLUCOSE 87 10/02/2019  BUN 20 10/02/2019   CREATININE 0.98  10/02/2019   BILITOT 0.6 10/02/2019   ALKPHOS 81 11/10/2016   AST 20 10/02/2019   ALT 14 10/02/2019   PROT 6.4 10/02/2019   ALBUMIN 4.0 01/09/2016   CALCIUM 9.1 10/02/2019   Lab Results  Component Value Date   CHOL 186 10/02/2019   Lab Results  Component Value Date   HDL 72 10/02/2019   Lab Results  Component Value Date   LDLCALC 100 (H) 10/02/2019   Lab Results  Component Value Date   TRIG 47 10/02/2019   Lab Results  Component Value Date   CHOLHDL 2.6 10/02/2019   Lab Results  Component Value Date   HGBA1C 5.2 01/09/2016       Assessment & Plan:    1. Acute bilateral low back pain without sciatica Start prednisone burst Restart Meloxicam AFTER finishing the prednisone - do not take at the same time Continue heat, home stretches, etc If you decide you want a physical therapy referral, please let us know.    - predniSONE (DELTASONE) 50 MG tablet; Take 1 tablet daily for 5 days. Do not take meloxicam at the same time.  Dispense: 5 tablet; Refill: 0   Follow-up with PCP for physical/labs in the next month or so or sooner if needed.    Terrilyn Saver, NP

## 2021-02-07 NOTE — Patient Instructions (Signed)
Start prednisone burst Restart Meloxicam AFTER finishing the prednisone - do not take at the same time Continue heat, home stretches, etc If you decide you want a physical therapy referral, please let us know.

## 2021-03-06 ENCOUNTER — Telehealth: Payer: Self-pay

## 2021-03-06 DIAGNOSIS — N410 Acute prostatitis: Secondary | ICD-10-CM

## 2021-03-06 DIAGNOSIS — I10 Essential (primary) hypertension: Secondary | ICD-10-CM

## 2021-03-06 NOTE — Telephone Encounter (Signed)
Orders placed for fasting labs.

## 2021-03-06 NOTE — Telephone Encounter (Signed)
Patient aware and will stop by next week

## 2021-03-06 NOTE — Telephone Encounter (Signed)
Patient called and would like lab orders placed for his upcoming physical on 03/21/2021

## 2021-03-12 DIAGNOSIS — I10 Essential (primary) hypertension: Secondary | ICD-10-CM | POA: Diagnosis not present

## 2021-03-13 LAB — LIPID PANEL
Cholesterol: 221 mg/dL — ABNORMAL HIGH (ref <200)
HDL: 96 mg/dL (ref >=40)
LDL Cholesterol (Calc): 112 mg/dL (calc) — ABNORMAL HIGH
Non-HDL Cholesterol (Calc): 125 mg/dL (calc) (ref <130)
Total CHOL/HDL Ratio: 2.3 (calc) (ref <5.0)
Triglycerides: 51 mg/dL (ref <150)

## 2021-03-13 LAB — CBC
HCT: 41.2 % (ref 38.5–50.0)
Hemoglobin: 14.1 g/dL (ref 13.2–17.1)
MCH: 32.6 pg (ref 27.0–33.0)
MCHC: 34.2 g/dL (ref 32.0–36.0)
MCV: 95.2 fL (ref 80.0–100.0)
MPV: 9.1 fL (ref 7.5–12.5)
Platelets: 227 10*3/uL (ref 140–400)
RBC: 4.33 10*6/uL (ref 4.20–5.80)
RDW: 12.3 % (ref 11.0–15.0)
WBC: 5.9 10*3/uL (ref 3.8–10.8)

## 2021-03-13 LAB — COMPREHENSIVE METABOLIC PANEL
AG Ratio: 1.9 (calc) (ref 1.0–2.5)
ALT: 12 U/L (ref 9–46)
AST: 19 U/L (ref 10–35)
Albumin: 4.2 g/dL (ref 3.6–5.1)
Alkaline phosphatase (APISO): 64 U/L (ref 35–144)
BUN: 17 mg/dL (ref 7–25)
CO2: 28 mmol/L (ref 20–32)
Calcium: 9.5 mg/dL (ref 8.6–10.3)
Chloride: 108 mmol/L (ref 98–110)
Creat: 1.02 mg/dL (ref 0.70–1.35)
Globulin: 2.2 g/dL (calc) (ref 1.9–3.7)
Glucose, Bld: 87 mg/dL (ref 65–99)
Potassium: 4.5 mmol/L (ref 3.5–5.3)
Sodium: 140 mmol/L (ref 135–146)
Total Bilirubin: 0.6 mg/dL (ref 0.2–1.2)
Total Protein: 6.4 g/dL (ref 6.1–8.1)

## 2021-03-13 LAB — TSH: TSH: 2.06 mIU/L (ref 0.40–4.50)

## 2021-03-13 LAB — PSA, TOTAL AND FREE
PSA, % Free: 21 % (calc) — ABNORMAL LOW (ref >25)
PSA, Free: 0.3 ng/mL
PSA, Total: 1.4 ng/mL (ref <=4.0)

## 2021-03-21 ENCOUNTER — Ambulatory Visit (INDEPENDENT_AMBULATORY_CARE_PROVIDER_SITE_OTHER): Payer: Medicare Other | Admitting: Sports Medicine

## 2021-03-21 ENCOUNTER — Other Ambulatory Visit: Payer: Self-pay

## 2021-03-21 DIAGNOSIS — N410 Acute prostatitis: Secondary | ICD-10-CM

## 2021-03-21 DIAGNOSIS — R300 Dysuria: Secondary | ICD-10-CM

## 2021-03-21 LAB — POCT URINALYSIS DIP (CLINITEK)
Bilirubin, UA: NEGATIVE
Glucose, UA: NEGATIVE mg/dL
Nitrite, UA: POSITIVE — AB
POC PROTEIN,UA: 100 — AB
Spec Grav, UA: 1.025 (ref 1.010–1.025)
Urobilinogen, UA: 0.2 E.U./dL
pH, UA: 5.5 (ref 5.0–8.0)

## 2021-03-21 MED ORDER — TADALAFIL 5 MG PO TABS: 5.0000 mg | ORAL_TABLET | Freq: Every day | ORAL | 11 refills | Status: DC

## 2021-03-21 MED ORDER — NITROFURANTOIN MONOHYD MACRO 100 MG PO CAPS: 100.0000 mg | ORAL_CAPSULE | Freq: Two times a day (BID) | ORAL | 0 refills | Status: DC

## 2021-03-21 NOTE — Assessment & Plan Note (Signed)
This is a pleasant 71 year old male, he has known BPH, obstructive uropathy symptoms including weak stream, dribbling, hesitancy, and going multiple times today, 10-15 at times. He had prostatitis about 2-1/2 years ago. We were never able to culture an organism. Now having increasing burning, cloudiness, sweats. Overall malaise. Urinalysis is positive for nitrites, leukocytes and blood all suggestive of an infection with Enterobacteriaceae likely E. coli, we will culture the urine, adding nitrofurantoin as it does have a good MIC, we will call in 28 days worth and await cultures. Continue Flomax, adding tadalafil 5. At the follow-up we will probably get him in with urology +/- add a 5 alpha reductase inhibitor.

## 2021-03-21 NOTE — Progress Notes (Signed)
° ° °  Procedures performed today:    None.  Independent interpretation of notes and tests performed by another provider:   None.  Brief History, Exam, Impression, and Recommendations:    Prostatitis This is a pleasant 71 year old male, he has known BPH, obstructive uropathy symptoms including weak stream, dribbling, hesitancy, and going multiple times today, 10-15 at times. He had prostatitis about 2-1/2 years ago. We were never able to culture an organism. Now having increasing burning, cloudiness, sweats. Overall malaise. Urinalysis is positive for nitrites, leukocytes and blood all suggestive of an infection with Enterobacteriaceae likely E. coli, we will culture the urine, adding nitrofurantoin as it does have a good MIC, we will call in 28 days worth and await cultures. Continue Flomax, adding tadalafil 5. At the follow-up we will probably get him in with urology +/- add a 5 alpha reductase inhibitor.    ___________________________________________ [name]. Dianah Field, M.D., ABFM., CAQSM. Primary Care and [place] Instructor of [place] of [facility] of Medicine

## 2021-03-24 ENCOUNTER — Encounter: Payer: Self-pay | Admitting: Sports Medicine

## 2021-03-24 ENCOUNTER — Telehealth: Payer: Self-pay

## 2021-03-24 NOTE — Telephone Encounter (Signed)
Have him come see me tomorrow morning, I do have an 11:30 slot.  If not morning I do have some afternoon slots as well.  He may need some Rocephin.

## 2021-03-24 NOTE — Telephone Encounter (Signed)
Patient calls to report having air and bubbles when he urinates along with a fever over the weekend. He states his BP his morning was quite low as well. Please advise.

## 2021-03-24 NOTE — Telephone Encounter (Signed)
I have scheduled the patient with Dr. Dianah Field 01/31 at 11:30 AM.  As I was scheduling the patient was sending another MyChart message to Dr. Dianah Field.   He is still running a low grade fever around 100   He started a new medication on Friday and is wondering if this could be the cause of his symptoms that started on Saturday.

## 2021-03-25 ENCOUNTER — Other Ambulatory Visit: Payer: Self-pay

## 2021-03-25 ENCOUNTER — Telehealth: Payer: Self-pay

## 2021-03-25 ENCOUNTER — Ambulatory Visit (INDEPENDENT_AMBULATORY_CARE_PROVIDER_SITE_OTHER): Payer: Medicare Other | Admitting: Sports Medicine

## 2021-03-25 VITALS — BP 94/62 | HR 66 | Temp 97.6°F | Wt 172.0 lb

## 2021-03-25 DIAGNOSIS — N410 Acute prostatitis: Secondary | ICD-10-CM | POA: Diagnosis not present

## 2021-03-25 LAB — POCT URINALYSIS DIP (CLINITEK)
Bilirubin, UA: NEGATIVE
Glucose, UA: NEGATIVE mg/dL
Ketones, POC UA: NEGATIVE mg/dL
Nitrite, UA: NEGATIVE
POC PROTEIN,UA: 100 — AB
Spec Grav, UA: 1.015 (ref 1.010–1.025)
Urobilinogen, UA: 0.2 E.U./dL
pH, UA: 6 (ref 5.0–8.0)

## 2021-03-25 MED ORDER — CEFDINIR 300 MG PO CAPS: 300.0000 mg | ORAL_CAPSULE | Freq: Two times a day (BID) | ORAL | 0 refills | Status: DC

## 2021-03-25 MED ORDER — CEFTRIAXONE SODIUM 1 G IJ SOLR
1.0000 g | Freq: Once | INTRAMUSCULAR | Status: AC
  Administered 2021-03-25: 1 g via INTRAMUSCULAR

## 2021-03-25 NOTE — Progress Notes (Signed)
° ° °  Procedures performed today:    Rocephin 1000 mg intramuscular today  Independent interpretation of notes and tests performed by another provider:   None.  Brief History, Exam, Impression, and Recommendations:    Prostatitis Keith Moses returns, I saw him 4 days ago, he was having dribbling, hesitancy, burning, weak stream. We obtained a urine sample, which was positive for leukocytes, nitrites, blood, started Macrobid and sent this off for culture. He did have some malaise. We added nitrofurantoin as it had a good MIC, unfortunately he returns today starting to feel significantly worse. He has had blood pressures dropping into the 38V systolic at home with a pulse rate into the 80s, today he is 90/60 with a pulse rate of 66. Discontinue valsartan, discontinue Flomax for now. Rocephin 1 g intramuscular, adding an oral third-generation cephalosporin (Omnicef) as well, discontinue nitrofurantoin.  If Omnicef not covered we will switch to Suprax, 14-day course. I would like a CBC with differential, CMP, blood cultures. He will hydrate aggressively with salt containing beverages. And he needs to touch base with me every couple of days on MyChart. Return to see [place] next week. If worsening of symptoms we will consider a CT pelvis with contrast.    ___________________________________________ [name]. Dianah Field, M.D., ABFM., CAQSM. Primary Care and [place] Instructor of [place] of [facility] of Medicine

## 2021-03-25 NOTE — Assessment & Plan Note (Addendum)
Keith Moses returns, I saw him 4 days ago, he was having dribbling, hesitancy, burning, weak stream. We obtained a urine sample, which was positive for leukocytes, nitrites, blood, started Macrobid and sent this off for culture. He did have some malaise. We added nitrofurantoin as it had a good MIC, unfortunately he returns today starting to feel significantly worse. He has had blood pressures dropping into the 41D systolic at home with a pulse rate into the 80s, today he is 90/60 with a pulse rate of 66. Discontinue valsartan, discontinue Flomax for now. Rocephin 1 g intramuscular, adding an oral third-generation cephalosporin (Omnicef) as well, discontinue nitrofurantoin.  If Omnicef not covered we will switch to Suprax, 14-day course. I would like a CBC with differential, CMP, blood cultures. He will hydrate aggressively with salt containing beverages. And he needs to touch base with me every couple of days on MyChart. Return to see [place] next week. If worsening of symptoms we will consider a CT pelvis with contrast.

## 2021-03-25 NOTE — Patient Instructions (Signed)
Switching antibiotics orally, also adding an injection antibiotic. Blood work. Aggressive hydration with salt containing beverages. Hold Flomax and hold valsartan for now, return to see me next week.

## 2021-03-27 ENCOUNTER — Encounter: Payer: Self-pay | Admitting: Sports Medicine

## 2021-03-27 LAB — URINE CULTURE
MICRO NUMBER:: 12946601
SPECIMEN QUALITY:: ADEQUATE

## 2021-03-27 NOTE — Telephone Encounter (Signed)
Error

## 2021-03-28 ENCOUNTER — Encounter: Payer: Self-pay | Admitting: Sports Medicine

## 2021-03-30 ENCOUNTER — Encounter: Payer: Self-pay | Admitting: Sports Medicine

## 2021-03-30 LAB — CBC WITH DIFFERENTIAL/PLATELET
Absolute Monocytes: 990 cells/uL — ABNORMAL HIGH (ref 200–950)
Basophils Absolute: 60 cells/uL (ref 0–200)
Basophils Relative: 1 %
Eosinophils Absolute: 198 cells/uL (ref 15–500)
Eosinophils Relative: 3.3 %
HCT: 40.8 % (ref 38.5–50.0)
Hemoglobin: 14 g/dL (ref 13.2–17.1)
Lymphs Abs: 1872 cells/uL (ref 850–3900)
MCH: 32.3 pg (ref 27.0–33.0)
MCHC: 34.3 g/dL (ref 32.0–36.0)
MCV: 94.2 fL (ref 80.0–100.0)
MPV: 9.2 fL (ref 7.5–12.5)
Monocytes Relative: 16.5 %
Neutro Abs: 2880 cells/uL (ref 1500–7800)
Neutrophils Relative %: 48 %
Platelets: 249 10*3/uL (ref 140–400)
RBC: 4.33 10*6/uL (ref 4.20–5.80)
RDW: 12.4 % (ref 11.0–15.0)
Total Lymphocyte: 31.2 %
WBC: 6 10*3/uL (ref 3.8–10.8)

## 2021-03-30 LAB — CULTURE, BLOOD (SINGLE): MICRO NUMBER:: 12943103

## 2021-03-30 LAB — COMPREHENSIVE METABOLIC PANEL
AG Ratio: 1.3 (calc) (ref 1.0–2.5)
ALT: 28 U/L (ref 9–46)
AST: 31 U/L (ref 10–35)
Albumin: 3.6 g/dL (ref 3.6–5.1)
Alkaline phosphatase (APISO): 76 U/L (ref 35–144)
BUN/Creatinine Ratio: 32 (calc) — ABNORMAL HIGH (ref 6–22)
BUN: 31 mg/dL — ABNORMAL HIGH (ref 7–25)
CO2: 25 mmol/L (ref 20–32)
Calcium: 9.2 mg/dL (ref 8.6–10.3)
Chloride: 103 mmol/L (ref 98–110)
Creat: 0.96 mg/dL (ref 0.70–1.35)
Globulin: 2.8 g/dL (calc) (ref 1.9–3.7)
Glucose, Bld: 96 mg/dL (ref 65–139)
Potassium: 3.8 mmol/L (ref 3.5–5.3)
Sodium: 136 mmol/L (ref 135–146)
Total Bilirubin: 0.3 mg/dL (ref 0.2–1.2)
Total Protein: 6.4 g/dL (ref 6.1–8.1)

## 2021-04-01 ENCOUNTER — Other Ambulatory Visit: Payer: Self-pay

## 2021-04-01 ENCOUNTER — Ambulatory Visit (INDEPENDENT_AMBULATORY_CARE_PROVIDER_SITE_OTHER): Payer: Medicare Other | Admitting: Sports Medicine

## 2021-04-01 DIAGNOSIS — N410 Acute prostatitis: Secondary | ICD-10-CM

## 2021-04-01 DIAGNOSIS — I10 Essential (primary) hypertension: Secondary | ICD-10-CM | POA: Diagnosis not present

## 2021-04-01 NOTE — Assessment & Plan Note (Signed)
Became a bit hypotensive during his episode of prostatitis, we discontinued his valsartan. Blood pressures do tend to be labile, I think in a week or 2 we can go ahead and restart his valsartan at one half tab twice a day instead of 1 full tab daily. Return to see me for the physical and we can discuss this further.

## 2021-04-01 NOTE — Assessment & Plan Note (Signed)
Pleasant 71 year old male, follow-up for prostatitis, doing much better with antibiotics, still with some fullness in the pelvic region, but voiding adequately, if he still has fullness in the pelvic region at the 2-week follow-up for his physical we will get a pelvic MRI with and without contrast.

## 2021-04-01 NOTE — Progress Notes (Signed)
° ° °  Procedures performed today:    None.  Independent interpretation of notes and tests performed by another provider:   None.  Brief History, Exam, Impression, and Recommendations:    Prostatitis Pleasant 71 year old male, follow-up for prostatitis, doing much better with antibiotics, still with some fullness in the pelvic region, but voiding adequately, if he still has fullness in the pelvic region at the 2-week follow-up for his physical we will get a pelvic MRI with and without contrast.  Essential hypertension, benign Became a bit hypotensive during his episode of prostatitis, we discontinued his valsartan. Blood pressures do tend to be labile, I think in a week or 2 we can go ahead and restart his valsartan at one half tab twice a day instead of 1 full tab daily. Return to see me for the physical and we can discuss this further.    ___________________________________________ [name]. Dianah Field, M.D., ABFM., CAQSM. Primary Care and [place] Instructor of [place] of [facility] of Medicine

## 2021-04-10 ENCOUNTER — Encounter: Payer: Self-pay | Admitting: Sports Medicine

## 2021-04-10 DIAGNOSIS — N410 Acute prostatitis: Secondary | ICD-10-CM

## 2021-04-15 NOTE — Telephone Encounter (Signed)
Please let Ed know how things are going with the MRI.

## 2021-04-18 ENCOUNTER — Other Ambulatory Visit: Payer: Self-pay

## 2021-04-18 ENCOUNTER — Ambulatory Visit
Admission: RE | Admit: 2021-04-18 | Discharge: 2021-04-18 | Disposition: A | Discharge status: Home / Self Care | Payer: Medicare Other | Source: Ambulatory Visit | Attending: Sports Medicine | Admitting: Sports Medicine

## 2021-04-18 ENCOUNTER — Ambulatory Visit (INDEPENDENT_AMBULATORY_CARE_PROVIDER_SITE_OTHER): Payer: Medicare Other | Admitting: Sports Medicine

## 2021-04-18 VITALS — BP 146/81 | HR 89 | Wt 171.0 lb

## 2021-04-18 DIAGNOSIS — R002 Palpitations: Secondary | ICD-10-CM | POA: Diagnosis not present

## 2021-04-18 DIAGNOSIS — Z0000 Encounter for general adult medical examination without abnormal findings: Secondary | ICD-10-CM

## 2021-04-18 DIAGNOSIS — E785 Hyperlipidemia, unspecified: Secondary | ICD-10-CM | POA: Diagnosis not present

## 2021-04-18 DIAGNOSIS — M5416 Radiculopathy, lumbar region: Secondary | ICD-10-CM

## 2021-04-18 DIAGNOSIS — I10 Essential (primary) hypertension: Secondary | ICD-10-CM

## 2021-04-18 DIAGNOSIS — N410 Acute prostatitis: Secondary | ICD-10-CM | POA: Diagnosis not present

## 2021-04-18 DIAGNOSIS — N321 Vesicointestinal fistula: Secondary | ICD-10-CM | POA: Diagnosis not present

## 2021-04-18 DIAGNOSIS — L409 Psoriasis, unspecified: Secondary | ICD-10-CM | POA: Diagnosis not present

## 2021-04-18 DIAGNOSIS — K6389 Other specified diseases of intestine: Secondary | ICD-10-CM | POA: Diagnosis not present

## 2021-04-18 DIAGNOSIS — F411 Generalized anxiety disorder: Secondary | ICD-10-CM

## 2021-04-18 DIAGNOSIS — F17200 Nicotine dependence, unspecified, uncomplicated: Secondary | ICD-10-CM

## 2021-04-18 DIAGNOSIS — K5730 Diverticulosis of large intestine without perforation or abscess without bleeding: Secondary | ICD-10-CM | POA: Diagnosis not present

## 2021-04-18 LAB — POCT URINALYSIS DIP (CLINITEK)
Bilirubin, UA: NEGATIVE
Glucose, UA: NEGATIVE mg/dL
Ketones, POC UA: NEGATIVE mg/dL
Nitrite, UA: NEGATIVE
POC PROTEIN,UA: NEGATIVE
Spec Grav, UA: 1.01 (ref 1.010–1.025)
Urobilinogen, UA: 0.2 E.U./dL
pH, UA: 7 (ref 5.0–8.0)

## 2021-04-18 MED ORDER — ATORVASTATIN CALCIUM 40 MG PO TABS: 40.0000 mg | ORAL_TABLET | Freq: Every day | ORAL | 3 refills | Status: DC

## 2021-04-18 MED ORDER — CEFDINIR 300 MG PO CAPS: 300.0000 mg | ORAL_CAPSULE | Freq: Two times a day (BID) | ORAL | 0 refills | Status: DC

## 2021-04-18 MED ORDER — GADOBENATE DIMEGLUMINE 529 MG/ML IV SOLN
15.0000 mL | Freq: Once | INTRAVENOUS | Status: AC | PRN
  Administered 2021-04-18: 15 mL via INTRAVENOUS

## 2021-04-18 MED ORDER — BETAMETHASONE DIPROPIONATE POWD: 11 refills | Status: DC

## 2021-04-18 MED ORDER — SHINGRIX 50 MCG/0.5ML IM SUSR: 0.5000 mL | Freq: Once | INTRAMUSCULAR | 0 refills | Status: AC

## 2021-04-18 MED ORDER — CALCIPOTRIENE 0.005 % EX CREA: TOPICAL_CREAM | Freq: Two times a day (BID) | CUTANEOUS | 11 refills | Status: DC

## 2021-04-18 NOTE — Progress Notes (Addendum)
Subjective:    CC: Annual Physical Exam  HPI:  This patient is here for their annual physical  I reviewed the past medical history, family history, social history, surgical history, and allergies today and no changes were needed.  Please see the problem list section below in epic for further details.  Past Medical History: Past Medical History:  Diagnosis Date   Hyperlipidemia    Hypertension    Past Surgical History: Past Surgical History:  Procedure Laterality Date   HERNIA REPAIR     Social History: Social History   Socioeconomic History   Marital status: Married    Spouse name: Karma Hiney   Number of children: 0   Years of education: 13   Highest education level: Some college, no degree  Occupational History    Comment: Retired  Tobacco Use   Smoking status: Every Day    Packs/day: 0.50    Years: 35.00    Pack years: 17.50    Types: Cigarettes   Smokeless tobacco: Never  Substance and Sexual Activity   Alcohol use: Yes    Alcohol/week: 3.0 - 4.0 standard drinks    Types: 3 - 4 Cans of beer per week    Comment: 3-4 beers daily   Drug use: No   Sexual activity: Not on file  Other Topics Concern   Not on file  Social History Narrative   Lives with wife and his brother in [place]. Enjoys woodworking, gardening and painting.   Social Determinants of Health   Financial Resource Strain: Low Risk    Difficulty of Paying Living Expenses: Not hard at all  Food Insecurity: No Food Insecurity   Worried About Charity fundraiser in the Last Year: Never true   [place] in the Last Year: Never true  Transportation Needs: No Transportation Needs   Lack of Transportation (Medical): No   Lack of Transportation (Non-Medical): No  Physical Activity: Inactive   Days of Exercise per Week: 0 days   Minutes of Exercise per Session: 0 min  Stress: No Stress Concern Present   Feeling of Stress : Not at all  Social Connections: Moderately Integrated   Frequency of  Communication with Friends and Family: Twice a week   Frequency of Social Gatherings with Friends and Family: Once a week   Attends Religious Services: 1 to 4 times per year   Active Member of [employer] or Organizations: No   Attends Archivist Meetings: Never   Marital Status: Married   Family History: Family History  Problem Relation Age of Onset   Hypertension Mother    Cancer Mother    Cancer Father        prostate and stomach CA   Allergies: Allergies  Allergen Reactions   Doxycycline Nausea And Vomiting   Medications: See med rec.  Review of Systems: No headache, visual changes, nausea, vomiting, diarrhea, constipation, dizziness, abdominal pain, skin rash, fevers, chills, night sweats, swollen lymph nodes, weight loss, chest pain, body aches, joint swelling, muscle aches, shortness of breath, mood changes, visual or auditory hallucinations.  Objective:    General: Well Developed, well nourished, and in no acute distress.  Neuro: Alert and oriented x3, extra-ocular muscles intact, sensation grossly intact. Cranial nerves II through XII are intact, motor, sensory, and coordinative functions are all intact. HEENT: Normocephalic, atraumatic, pupils equal round reactive to light, neck supple, no masses, no lymphadenopathy, thyroid nonpalpable. Oropharynx, nasopharynx, external ear canals are unremarkable. Skin: Warm and dry,  no rashes noted.  Cardiac: Regular rate and rhythm, no murmurs rubs or gallops.  Respiratory: Clear to auscultation bilaterally. Not using accessory muscles, speaking in full sentences.  Abdominal: Soft, nontender, nondistended, positive bowel sounds, no masses, no organomegaly.  Musculoskeletal: Shoulder, elbow, wrist, hip, knee, ankle stable, and with full range of motion.  Impression and Recommendations:    The patient was counselled, risk factors were discussed, anticipatory guidance given.  Annual physical exam Annual physical as above,  sending Shingrix to pharmacy.  Essential hypertension, benign Mildly elevated. We will recheck before he leaves.  Hyperlipidemia Lipids are still elevated on atorvastatin 20, increasing to 40, recheck in 2 to 3 months.  Colovesical fistula Ed also continues to have problems with urination, he tends to have some urinary pneumatosis, we have treated him multiple times, we are doing a pelvic MRI with and without contrast today. Looking for signs of chronic infection versus fistula. Repeat urinalysis today showed blood and leukocytes, restarting antibiotics. Has not yet been contacted by urology, placing referral again.   Update: MRI reviewed, as expected there is a fistula between the colon diverticulosis and the bladder, referral to gastroenterology and colorectal surgery.  Psoriasis Refilling calcipotriene and betamethasone compounded cream.  Smoker Precontemplative.  Right lumbar radiculitis Sensation of heaviness in the lower extremities, stumbling, off balance. We do need to check B12 levels at some point, as well as further investigate his lumbar spine, we will do this after we get his prostate figured out.  Palpitations They occur at rest while drinking coffee, suspect PACs and PVCs, he does not want me to work this up just yet.  Generalized anxiety disorder Brother just passed away, he is grieving. Also having some insomnia, he wakes up at about 3:00am to void and then has to stay up afterwards. He will try changes in his sleep hygiene including less TV at night and trying melatonin when the sun sets to reset his internal clock. We can revisit this in the future.   ___________________________________________ [name]. Dianah Field, M.D., ABFM., CAQSM. Primary Care and Sports Medicine [place] [facility]  Adjunct Professor of [place] of [facility] of Medicine

## 2021-04-18 NOTE — Assessment & Plan Note (Signed)
Refilling calcipotriene and betamethasone compounded cream.

## 2021-04-18 NOTE — Assessment & Plan Note (Signed)
Sensation of heaviness in the lower extremities, stumbling, off balance. We do need to check B12 levels at some point, as well as further investigate his lumbar spine, we will do this after we get his prostate figured out.

## 2021-04-18 NOTE — Assessment & Plan Note (Signed)
Precontemplative

## 2021-04-18 NOTE — Assessment & Plan Note (Signed)
Lipids are still elevated on atorvastatin 20, increasing to 40, recheck in 2 to 3 months.

## 2021-04-18 NOTE — Assessment & Plan Note (Signed)
Annual physical as above, sending Shingrix to pharmacy.

## 2021-04-18 NOTE — Assessment & Plan Note (Addendum)
Brother just passed away, he is grieving. Also having some insomnia, he wakes up at about 3:00am to void and then has to stay up afterwards. He will try changes in his sleep hygiene including less TV at night and trying melatonin when the sun sets to reset his internal clock. We can revisit this in the future.

## 2021-04-18 NOTE — Assessment & Plan Note (Signed)
They occur at rest while drinking coffee, suspect PACs and PVCs, he does not want me to work this up just yet.

## 2021-04-18 NOTE — Assessment & Plan Note (Addendum)
Keith Moses also continues to have problems with urination, he tends to have some urinary pneumatosis, we have treated him multiple times, we are doing a pelvic MRI with and without contrast today. Looking for signs of chronic infection versus fistula. Repeat urinalysis today showed blood and leukocytes, restarting antibiotics. Has not yet been contacted by urology, placing referral again.  Update: MRI reviewed, as expected there is a fistula between the colon diverticulosis and the bladder, referral to gastroenterology and colorectal surgery.

## 2021-04-18 NOTE — Assessment & Plan Note (Signed)
Mildly elevated. We will recheck before he leaves.

## 2021-04-20 LAB — URINE CULTURE
MICRO NUMBER:: 13057399
SPECIMEN QUALITY:: ADEQUATE

## 2021-04-21 ENCOUNTER — Other Ambulatory Visit: Payer: Self-pay | Admitting: Sports Medicine

## 2021-04-21 ENCOUNTER — Other Ambulatory Visit: Payer: Medicare Other

## 2021-04-21 DIAGNOSIS — N410 Acute prostatitis: Secondary | ICD-10-CM

## 2021-04-23 ENCOUNTER — Other Ambulatory Visit: Payer: Self-pay

## 2021-04-23 ENCOUNTER — Ambulatory Visit
Admission: RE | Admit: 2021-04-23 | Discharge: 2021-04-23 | Disposition: A | Discharge status: Home / Self Care | Payer: Medicare Other | Source: Ambulatory Visit | Attending: Sports Medicine | Admitting: Sports Medicine

## 2021-04-23 DIAGNOSIS — N410 Acute prostatitis: Secondary | ICD-10-CM

## 2021-04-24 ENCOUNTER — Telehealth: Payer: Self-pay

## 2021-04-24 DIAGNOSIS — N321 Vesicointestinal fistula: Secondary | ICD-10-CM

## 2021-04-24 NOTE — Addendum Note (Signed)
Addended by: Silverio Decamp on: 04/24/2021 12:03 PM   Modules accepted: Orders

## 2021-04-24 NOTE — Telephone Encounter (Signed)
Patient left VM wanting to know how urgent it is for him to see Gertie Fey since he just got an appt scheduled with urology.  ?

## 2021-04-25 NOTE — Telephone Encounter (Signed)
Switching referral to gap. ?

## 2021-04-25 NOTE — Telephone Encounter (Signed)
The urology referral was before we knew there was a fistula, I still think he needs to work with urology, however treating a colovesicular fistula will require dual effort likely between gastroenterology and colorectal surgery.  We need all the correct brains involved in this procedure. ?

## 2021-04-25 NOTE — Telephone Encounter (Signed)
Patient has a urology appt on Monday that he is going to keep to address the prostate. He stated that he wants to stay local for the gastro referral and would like it to go to [facility] in [place].  ?

## 2021-04-28 DIAGNOSIS — M79602 Pain in left arm: Secondary | ICD-10-CM | POA: Diagnosis not present

## 2021-04-28 DIAGNOSIS — E785 Hyperlipidemia, unspecified: Secondary | ICD-10-CM | POA: Diagnosis not present

## 2021-04-28 DIAGNOSIS — Z881 Allergy status to other antibiotic agents status: Secondary | ICD-10-CM | POA: Diagnosis not present

## 2021-04-28 DIAGNOSIS — I10 Essential (primary) hypertension: Secondary | ICD-10-CM | POA: Diagnosis not present

## 2021-04-28 DIAGNOSIS — Z79899 Other long term (current) drug therapy: Secondary | ICD-10-CM | POA: Diagnosis not present

## 2021-04-28 DIAGNOSIS — Z87891 Personal history of nicotine dependence: Secondary | ICD-10-CM | POA: Diagnosis not present

## 2021-04-28 DIAGNOSIS — K219 Gastro-esophageal reflux disease without esophagitis: Secondary | ICD-10-CM | POA: Diagnosis not present

## 2021-04-28 DIAGNOSIS — Z5321 Procedure and treatment not carried out due to patient leaving prior to being seen by health care provider: Secondary | ICD-10-CM | POA: Diagnosis not present

## 2021-04-28 DIAGNOSIS — Z7982 Long term (current) use of aspirin: Secondary | ICD-10-CM | POA: Diagnosis not present

## 2021-04-28 DIAGNOSIS — M7918 Myalgia, other site: Secondary | ICD-10-CM | POA: Diagnosis not present

## 2021-04-28 DIAGNOSIS — M79622 Pain in left upper arm: Secondary | ICD-10-CM | POA: Diagnosis not present

## 2021-04-28 DIAGNOSIS — M7989 Other specified soft tissue disorders: Secondary | ICD-10-CM | POA: Diagnosis not present

## 2021-04-28 DIAGNOSIS — R600 Localized edema: Secondary | ICD-10-CM | POA: Diagnosis not present

## 2021-05-01 DIAGNOSIS — N321 Vesicointestinal fistula: Secondary | ICD-10-CM | POA: Diagnosis not present

## 2021-05-01 DIAGNOSIS — K5792 Diverticulitis of intestine, part unspecified, without perforation or abscess without bleeding: Secondary | ICD-10-CM | POA: Diagnosis not present

## 2021-05-06 DIAGNOSIS — N321 Vesicointestinal fistula: Secondary | ICD-10-CM | POA: Diagnosis not present

## 2021-05-06 DIAGNOSIS — K5792 Diverticulitis of intestine, part unspecified, without perforation or abscess without bleeding: Secondary | ICD-10-CM | POA: Diagnosis not present

## 2021-05-06 DIAGNOSIS — K5732 Diverticulitis of large intestine without perforation or abscess without bleeding: Secondary | ICD-10-CM | POA: Diagnosis not present

## 2021-05-16 ENCOUNTER — Ambulatory Visit (INDEPENDENT_AMBULATORY_CARE_PROVIDER_SITE_OTHER): Payer: Medicare Other | Admitting: Sports Medicine

## 2021-05-16 ENCOUNTER — Other Ambulatory Visit: Payer: Self-pay

## 2021-05-16 DIAGNOSIS — L409 Psoriasis, unspecified: Secondary | ICD-10-CM

## 2021-05-16 DIAGNOSIS — J440 Chronic obstructive pulmonary disease with acute lower respiratory infection: Secondary | ICD-10-CM

## 2021-05-16 DIAGNOSIS — J209 Acute bronchitis, unspecified: Secondary | ICD-10-CM | POA: Diagnosis not present

## 2021-05-16 MED ORDER — TRELEGY ELLIPTA 100-62.5-25 MCG/ACT IN AEPB: 1.0000 | INHALATION_SPRAY | Freq: Every day | RESPIRATORY_TRACT | 0 refills | Status: DC

## 2021-05-16 MED ORDER — BETAMETHASONE DIPROPIONATE 0.05 % EX OINT: TOPICAL_OINTMENT | Freq: Two times a day (BID) | CUTANEOUS | 0 refills | Status: DC

## 2021-05-16 MED ORDER — PREDNISONE 50 MG PO TABS: ORAL_TABLET | ORAL | 0 refills | Status: DC

## 2021-05-16 NOTE — Progress Notes (Signed)
? ? ?  Procedures performed today:   ? ?None. ? ?Independent interpretation of notes and tests performed by another provider:  ? ?None. ? ?Brief History, Exam, Impression, and Recommendations:   ? ?Acute bronchitis with COPD ([place]) ?Pleasant 71 year old male, has a colonoscopy coming up in anticipation of colon resection for colovesicular fistula. ?He unfortunately has developed a cough. ?He does have history of COPD, emphysema on chest CT and is a smoker. ?Lungs for the most part clear. ?Adding 5 days of steroids and Trelegy inhaler. ?Return to see me as needed. ? ? ? ?___________________________________________ ?[name]. Dianah Field, M.D., ABFM., CAQSM. ?Primary Care and Sports Medicine ?[place] ? ?Adjunct Instructor of Family Medicine  ?[employer] of Medicine ?

## 2021-05-16 NOTE — Assessment & Plan Note (Signed)
Pleasant 71 year old male, has a colonoscopy coming up in anticipation of colon resection for colovesicular fistula. ?He unfortunately has developed a cough. ?He does have history of COPD, emphysema on chest CT and is a smoker. ?Lungs for the most part clear. ?Adding 5 days of steroids and Trelegy inhaler. ?Return to see me as needed. ? ?

## 2021-05-16 NOTE — Progress Notes (Signed)
0

## 2021-05-20 DIAGNOSIS — K648 Other hemorrhoids: Secondary | ICD-10-CM | POA: Diagnosis not present

## 2021-05-20 DIAGNOSIS — D128 Benign neoplasm of rectum: Secondary | ICD-10-CM | POA: Diagnosis not present

## 2021-05-20 DIAGNOSIS — R933 Abnormal findings on diagnostic imaging of other parts of digestive tract: Secondary | ICD-10-CM | POA: Diagnosis not present

## 2021-05-20 DIAGNOSIS — D122 Benign neoplasm of ascending colon: Secondary | ICD-10-CM | POA: Diagnosis not present

## 2021-05-20 DIAGNOSIS — Z01818 Encounter for other preprocedural examination: Secondary | ICD-10-CM | POA: Diagnosis not present

## 2021-05-21 DIAGNOSIS — N321 Vesicointestinal fistula: Secondary | ICD-10-CM | POA: Diagnosis not present

## 2021-05-21 DIAGNOSIS — N390 Urinary tract infection, site not specified: Secondary | ICD-10-CM | POA: Diagnosis not present

## 2021-05-25 ENCOUNTER — Other Ambulatory Visit: Payer: Self-pay | Admitting: Sports Medicine

## 2021-05-25 DIAGNOSIS — N410 Acute prostatitis: Secondary | ICD-10-CM

## 2021-06-11 DIAGNOSIS — D649 Anemia, unspecified: Secondary | ICD-10-CM | POA: Diagnosis not present

## 2021-06-11 DIAGNOSIS — J40 Bronchitis, not specified as acute or chronic: Secondary | ICD-10-CM | POA: Diagnosis not present

## 2021-06-11 DIAGNOSIS — E7800 Pure hypercholesterolemia, unspecified: Secondary | ICD-10-CM | POA: Diagnosis not present

## 2021-06-11 DIAGNOSIS — I2510 Atherosclerotic heart disease of native coronary artery without angina pectoris: Secondary | ICD-10-CM | POA: Diagnosis not present

## 2021-06-11 DIAGNOSIS — I10 Essential (primary) hypertension: Secondary | ICD-10-CM | POA: Diagnosis not present

## 2021-06-11 DIAGNOSIS — N321 Vesicointestinal fistula: Secondary | ICD-10-CM | POA: Diagnosis not present

## 2021-06-11 DIAGNOSIS — I447 Left bundle-branch block, unspecified: Secondary | ICD-10-CM | POA: Diagnosis not present

## 2021-06-11 DIAGNOSIS — J449 Chronic obstructive pulmonary disease, unspecified: Secondary | ICD-10-CM | POA: Diagnosis not present

## 2021-06-11 DIAGNOSIS — Z01818 Encounter for other preprocedural examination: Secondary | ICD-10-CM | POA: Diagnosis not present

## 2021-06-11 DIAGNOSIS — R197 Diarrhea, unspecified: Secondary | ICD-10-CM | POA: Diagnosis not present

## 2021-06-11 DIAGNOSIS — R911 Solitary pulmonary nodule: Secondary | ICD-10-CM | POA: Diagnosis not present

## 2021-06-11 DIAGNOSIS — I7121 Aneurysm of the ascending aorta, without rupture: Secondary | ICD-10-CM | POA: Diagnosis not present

## 2021-06-11 DIAGNOSIS — R002 Palpitations: Secondary | ICD-10-CM | POA: Diagnosis not present

## 2021-06-12 DIAGNOSIS — R197 Diarrhea, unspecified: Secondary | ICD-10-CM | POA: Diagnosis not present

## 2021-06-27 DIAGNOSIS — R197 Diarrhea, unspecified: Secondary | ICD-10-CM | POA: Diagnosis not present

## 2021-06-27 DIAGNOSIS — K5792 Diverticulitis of intestine, part unspecified, without perforation or abscess without bleeding: Secondary | ICD-10-CM | POA: Diagnosis not present

## 2021-06-27 DIAGNOSIS — N321 Vesicointestinal fistula: Secondary | ICD-10-CM | POA: Diagnosis not present

## 2021-07-02 DIAGNOSIS — I447 Left bundle-branch block, unspecified: Secondary | ICD-10-CM | POA: Diagnosis not present

## 2021-07-02 DIAGNOSIS — F17200 Nicotine dependence, unspecified, uncomplicated: Secondary | ICD-10-CM | POA: Diagnosis not present

## 2021-07-02 DIAGNOSIS — I10 Essential (primary) hypertension: Secondary | ICD-10-CM | POA: Diagnosis not present

## 2021-07-02 DIAGNOSIS — E7800 Pure hypercholesterolemia, unspecified: Secondary | ICD-10-CM | POA: Diagnosis not present

## 2021-07-02 DIAGNOSIS — Z7689 Persons encountering health services in other specified circumstances: Secondary | ICD-10-CM | POA: Diagnosis not present

## 2021-07-02 DIAGNOSIS — I2510 Atherosclerotic heart disease of native coronary artery without angina pectoris: Secondary | ICD-10-CM | POA: Diagnosis not present

## 2021-07-06 ENCOUNTER — Other Ambulatory Visit: Payer: Self-pay | Admitting: Sports Medicine

## 2021-07-07 DIAGNOSIS — E785 Hyperlipidemia, unspecified: Secondary | ICD-10-CM | POA: Diagnosis not present

## 2021-07-07 DIAGNOSIS — Z7982 Long term (current) use of aspirin: Secondary | ICD-10-CM | POA: Diagnosis not present

## 2021-07-07 DIAGNOSIS — N321 Vesicointestinal fistula: Secondary | ICD-10-CM | POA: Diagnosis not present

## 2021-07-07 DIAGNOSIS — J449 Chronic obstructive pulmonary disease, unspecified: Secondary | ICD-10-CM | POA: Diagnosis not present

## 2021-07-07 DIAGNOSIS — F17210 Nicotine dependence, cigarettes, uncomplicated: Secondary | ICD-10-CM | POA: Diagnosis not present

## 2021-07-07 DIAGNOSIS — R110 Nausea: Secondary | ICD-10-CM | POA: Diagnosis not present

## 2021-07-07 DIAGNOSIS — Z881 Allergy status to other antibiotic agents status: Secondary | ICD-10-CM | POA: Diagnosis not present

## 2021-07-07 DIAGNOSIS — Z79899 Other long term (current) drug therapy: Secondary | ICD-10-CM | POA: Diagnosis not present

## 2021-07-07 DIAGNOSIS — I2510 Atherosclerotic heart disease of native coronary artery without angina pectoris: Secondary | ICD-10-CM | POA: Diagnosis not present

## 2021-07-07 DIAGNOSIS — I10 Essential (primary) hypertension: Secondary | ICD-10-CM | POA: Diagnosis not present

## 2021-07-10 DIAGNOSIS — N321 Vesicointestinal fistula: Secondary | ICD-10-CM | POA: Diagnosis not present

## 2021-07-10 DIAGNOSIS — Z1621 Resistance to vancomycin: Secondary | ICD-10-CM | POA: Diagnosis not present

## 2021-07-10 DIAGNOSIS — A09 Infectious gastroenteritis and colitis, unspecified: Secondary | ICD-10-CM | POA: Diagnosis not present

## 2021-07-10 DIAGNOSIS — I10 Essential (primary) hypertension: Secondary | ICD-10-CM | POA: Diagnosis not present

## 2021-07-16 ENCOUNTER — Other Ambulatory Visit: Payer: Self-pay | Admitting: Sports Medicine

## 2021-07-16 DIAGNOSIS — N410 Acute prostatitis: Secondary | ICD-10-CM

## 2021-07-18 ENCOUNTER — Encounter: Payer: Self-pay | Admitting: Sports Medicine

## 2021-07-22 DIAGNOSIS — R195 Other fecal abnormalities: Secondary | ICD-10-CM | POA: Diagnosis not present

## 2021-07-22 DIAGNOSIS — I10 Essential (primary) hypertension: Secondary | ICD-10-CM | POA: Diagnosis not present

## 2021-07-22 DIAGNOSIS — Z8719 Personal history of other diseases of the digestive system: Secondary | ICD-10-CM | POA: Diagnosis not present

## 2021-07-22 DIAGNOSIS — N321 Vesicointestinal fistula: Secondary | ICD-10-CM | POA: Diagnosis not present

## 2021-07-23 DIAGNOSIS — R195 Other fecal abnormalities: Secondary | ICD-10-CM | POA: Diagnosis not present

## 2021-07-30 ENCOUNTER — Ambulatory Visit (INDEPENDENT_AMBULATORY_CARE_PROVIDER_SITE_OTHER): Payer: Medicare Other | Admitting: Sports Medicine

## 2021-07-30 ENCOUNTER — Ambulatory Visit: Payer: Medicare Other | Admitting: Family Medicine

## 2021-07-30 ENCOUNTER — Encounter: Payer: Self-pay | Admitting: Sports Medicine

## 2021-07-30 DIAGNOSIS — N321 Vesicointestinal fistula: Secondary | ICD-10-CM | POA: Diagnosis not present

## 2021-07-30 DIAGNOSIS — R8290 Unspecified abnormal findings in urine: Secondary | ICD-10-CM | POA: Diagnosis not present

## 2021-07-30 MED ORDER — HYOSCYAMINE SULFATE ER 0.375 MG PO TB12: 0.3750 mg | ORAL_TABLET | Freq: Two times a day (BID) | ORAL | 0 refills | Status: DC

## 2021-07-30 NOTE — Progress Notes (Addendum)
    Procedures performed today:    None.  Independent interpretation of notes and tests performed by another provider:   None.  Brief History, Exam, Impression, and Recommendations:    Colovesical fistula Ed returns, he is a complex process, he has sigmoid diverticulitis with colovesicular fistula. He had multiple courses of antibiotics. He was scheduled for robotic sigmoidectomy with closure of the fistula, but this was canceled secondary to anesthesia. He had a few episodes of C. difficile, he also had enteropathogenic E. coli noted on a stool test. Has had Dificid and vancomycin. He had a GI panel just a week ago that showed negative C. difficile toxin. Unfortunately is continued to have significant diarrhea, up to 15 times per day, significant gassiness, tenesmus. He is starting to feel dizzy and weak. I think he is likely getting additionally dehydrated, he will continue with aggressive Gatorade supplementation. I would like CBC, CMP, stool studies, urinalysis to evaluate for high specific gravity. The question is whether or not he is having recurrence of C. difficile or enteropathogenic E. coli infection, versus postinfectious IBS. His GI provider added a bile acid binding resin that only provided a day or so of relief. Only minimal belly pain, more discomfort. We will add Levbid to try to block his spasm and symptomatology for now. I would like him to message me sometime end of the week and early next week to see how things are going.  Update:  Unfortunately c diff toxin is positive again.  Enteropathogenic Ecoli also positive again.  Cipro is usually used but there is evidence of resistance, azithromycin has good activity against it.  We will do a course of azithro.  In addition we probabaly should add rifaximin for persistent c diff.  If ok with him I will send these in     ___________________________________________ [name]. Dianah Field, M.D., ABFM., CAQSM. Primary Care  and [place] Instructor of [place] of [facility] of Medicine

## 2021-07-30 NOTE — Assessment & Plan Note (Addendum)
Ed returns, he is a complex process, he has sigmoid diverticulitis with colovesicular fistula. He had multiple courses of antibiotics. He was scheduled for robotic sigmoidectomy with closure of the fistula, but this was canceled secondary to anesthesia. He had a few episodes of C. difficile, he also had enteropathogenic E. coli noted on a stool test. Has had Dificid and vancomycin. He had a GI panel just a week ago that showed negative C. difficile toxin. Unfortunately is continued to have significant diarrhea, up to 15 times per day, significant gassiness, tenesmus. He is starting to feel dizzy and weak. I think he is likely getting additionally dehydrated, he will continue with aggressive Gatorade supplementation. I would like CBC, CMP, stool studies, urinalysis to evaluate for high specific gravity. The question is whether or not he is having recurrence of C. difficile or enteropathogenic E. coli infection, versus postinfectious IBS. His GI provider added a bile acid binding resin that only provided a day or so of relief. Only minimal belly pain, more discomfort. We will add Levbid to try to block his spasm and symptomatology for now. I would like him to message me sometime end of the week and early next week to see how things are going.  Update:  Unfortunately c diff toxin is positive again.  Enteropathogenic Ecoli also positive again.  Cipro is usually used but there is evidence of resistance, azithromycin has good activity against it.  We will do a course of azithro.  In addition we probabaly should add rifaximin for persistent c diff.  If ok with him I will send these in

## 2021-07-31 DIAGNOSIS — R197 Diarrhea, unspecified: Secondary | ICD-10-CM | POA: Diagnosis not present

## 2021-07-31 DIAGNOSIS — N321 Vesicointestinal fistula: Secondary | ICD-10-CM | POA: Diagnosis not present

## 2021-08-01 DIAGNOSIS — I10 Essential (primary) hypertension: Secondary | ICD-10-CM | POA: Diagnosis not present

## 2021-08-01 DIAGNOSIS — I2510 Atherosclerotic heart disease of native coronary artery without angina pectoris: Secondary | ICD-10-CM | POA: Diagnosis not present

## 2021-08-01 DIAGNOSIS — I447 Left bundle-branch block, unspecified: Secondary | ICD-10-CM | POA: Diagnosis not present

## 2021-08-01 DIAGNOSIS — R002 Palpitations: Secondary | ICD-10-CM | POA: Diagnosis not present

## 2021-08-02 LAB — GI PATHOGEN PANEL BY PCR, STOOL
Adenovirus F40/41: NEGATIVE
Astrovirus: NEGATIVE
C.DIFFICILE TOXIN: POSITIVE
CRYPTOSPORIDIUM SPECIES: NEGATIVE
Campylobacter species: NEGATIVE
Cyclospora cayetanensis: NEGATIVE
ENTEROAGGREGATIVE E. COLI (EAEC): NEGATIVE
ENTEROPATHOGENIC E. COLI (EPEC): POSITIVE
ENTEROTOXIGENIC E. COLI (ETEC): NEGATIVE
Entamoeba histolytica: NEGATIVE
GIARDIA: NEGATIVE
Norovirus GI/GII: NEGATIVE
Plesiomonas shigelloides: NEGATIVE
ROTAVIRUS: NEGATIVE
SHIGA TOXIN PRODUCING E. COLI: NEGATIVE
SHIGELLA/ENTEROINVASIVE E. COLI: NEGATIVE
Salmonella species: NEGATIVE
Sapovirus: NEGATIVE
Vibrio cholerae: NEGATIVE
Vibrio species: NEGATIVE
YERSINIA SPECIES: NEGATIVE

## 2021-08-03 LAB — COMPREHENSIVE METABOLIC PANEL
AG Ratio: 1.5 (calc) (ref 1.0–2.5)
ALT: 14 U/L (ref 9–46)
AST: 19 U/L (ref 10–35)
Albumin: 4.3 g/dL (ref 3.6–5.1)
Alkaline phosphatase (APISO): 77 U/L (ref 35–144)
BUN: 16 mg/dL (ref 7–25)
CO2: 25 mmol/L (ref 20–32)
Calcium: 9.9 mg/dL (ref 8.6–10.3)
Chloride: 109 mmol/L (ref 98–110)
Creat: 0.89 mg/dL (ref 0.70–1.35)
Globulin: 2.8 g/dL (calc) (ref 1.9–3.7)
Glucose, Bld: 77 mg/dL (ref 65–99)
Potassium: 4 mmol/L (ref 3.5–5.3)
Sodium: 142 mmol/L (ref 135–146)
Total Bilirubin: 0.4 mg/dL (ref 0.2–1.2)
Total Protein: 7.1 g/dL (ref 6.1–8.1)

## 2021-08-03 LAB — URINALYSIS W MICROSCOPIC + REFLEX CULTURE
Bilirubin Urine: NEGATIVE
Glucose, UA: NEGATIVE
Hyaline Cast: NONE SEEN /LPF
Ketones, ur: NEGATIVE
Nitrites, Initial: POSITIVE — AB
Protein, ur: NEGATIVE
RBC / HPF: NONE SEEN /HPF (ref 0–2)
Specific Gravity, Urine: 1.008 (ref 1.001–1.035)
Squamous Epithelial / HPF: NONE SEEN /HPF (ref <=5)
pH: 6.5 (ref 5.0–8.0)

## 2021-08-03 LAB — CBC WITH DIFFERENTIAL/PLATELET
Absolute Monocytes: 478 cells/uL (ref 200–950)
Basophils Absolute: 52 cells/uL (ref 0–200)
Basophils Relative: 1 %
Eosinophils Absolute: 192 cells/uL (ref 15–500)
Eosinophils Relative: 3.7 %
HCT: 42.4 % (ref 38.5–50.0)
Hemoglobin: 14.4 g/dL (ref 13.2–17.1)
Lymphs Abs: 1498 cells/uL (ref 850–3900)
MCH: 31.9 pg (ref 27.0–33.0)
MCHC: 34 g/dL (ref 32.0–36.0)
MCV: 94 fL (ref 80.0–100.0)
MPV: 9.4 fL (ref 7.5–12.5)
Monocytes Relative: 9.2 %
Neutro Abs: 2980 cells/uL (ref 1500–7800)
Neutrophils Relative %: 57.3 %
Platelets: 218 10*3/uL (ref 140–400)
RBC: 4.51 10*6/uL (ref 4.20–5.80)
RDW: 12.8 % (ref 11.0–15.0)
Total Lymphocyte: 28.8 %
WBC: 5.2 10*3/uL (ref 3.8–10.8)

## 2021-08-03 LAB — URINE CULTURE
MICRO NUMBER:: 13497905
SPECIMEN QUALITY:: ADEQUATE

## 2021-08-03 LAB — CULTURE INDICATED

## 2021-08-04 ENCOUNTER — Encounter: Payer: Self-pay | Admitting: Sports Medicine

## 2021-08-05 ENCOUNTER — Other Ambulatory Visit: Payer: Self-pay | Admitting: Sports Medicine

## 2021-08-05 DIAGNOSIS — N321 Vesicointestinal fistula: Secondary | ICD-10-CM | POA: Diagnosis not present

## 2021-08-05 DIAGNOSIS — I10 Essential (primary) hypertension: Secondary | ICD-10-CM | POA: Diagnosis not present

## 2021-08-05 DIAGNOSIS — R197 Diarrhea, unspecified: Secondary | ICD-10-CM | POA: Diagnosis not present

## 2021-08-06 ENCOUNTER — Telehealth: Payer: Self-pay

## 2021-08-06 ENCOUNTER — Other Ambulatory Visit: Payer: Self-pay | Admitting: Medical-Surgical

## 2021-08-06 MED ORDER — AZITHROMYCIN 500 MG PO TABS: ORAL_TABLET | ORAL | 0 refills | Status: DC

## 2021-08-06 NOTE — Progress Notes (Signed)
Azithromycin 500 mg daily x3 days has been sent to the pharmacy.  He should pick this up and get started on it as instructed by GI.  Thanks, Caryl Asp

## 2021-08-06 NOTE — Telephone Encounter (Signed)
Patient advised.

## 2021-08-06 NOTE — Progress Notes (Signed)
Patient advised.

## 2021-08-06 NOTE — Telephone Encounter (Signed)
Keith Moses called and states the GI provider agreed with Dr Dianah Field about treating him for [facility] with azithromycin.     Result note -   Unfortunately c diff toxin is positive again.  Enteropathogenic Ecoli also positive again.  Cipro is usually used but there is evidence of resistance, azithromycin has good activity against it.  We will do a course of azithro.  In addition we probabaly should add rifaximin for persistent c diff.  If ok with him I will send these in.     Note from Dr Dianah Field -  The C. difficile test we did was for the actual toxin, not the gene.  This means that C. difficile is actively producing the toxin, this is not just looking for an antibody that can remain positive long after the bacteria is gone.  As for the enteropathogenic E. coli I do think we should treat it particularly since you are having symptoms.  I am however also completely okay with you getting the gastroenterologist to weigh in, however please keep me in the loop with what they suggest.  ___________________________________________ [name]. Dianah Field, M.D., ABFM., CAQSM. Primary Care and [place] Instructor of [place] of [facility] of Medicine

## 2021-08-11 MED ORDER — AZITHROMYCIN 250 MG PO TABS: ORAL_TABLET | ORAL | 0 refills | Status: DC

## 2021-08-11 NOTE — Addendum Note (Signed)
Addended by: Silverio Decamp on: 08/11/2021 05:00 PM   Modules accepted: Orders

## 2021-08-17 ENCOUNTER — Other Ambulatory Visit: Payer: Self-pay | Admitting: Sports Medicine

## 2021-08-17 DIAGNOSIS — F411 Generalized anxiety disorder: Secondary | ICD-10-CM

## 2021-08-17 DIAGNOSIS — M5416 Radiculopathy, lumbar region: Secondary | ICD-10-CM

## 2021-08-19 DIAGNOSIS — Z01812 Encounter for preprocedural laboratory examination: Secondary | ICD-10-CM | POA: Diagnosis not present

## 2021-08-19 DIAGNOSIS — R197 Diarrhea, unspecified: Secondary | ICD-10-CM | POA: Diagnosis not present

## 2021-08-19 DIAGNOSIS — I7121 Aneurysm of the ascending aorta, without rupture: Secondary | ICD-10-CM | POA: Diagnosis not present

## 2021-08-19 DIAGNOSIS — Z01818 Encounter for other preprocedural examination: Secondary | ICD-10-CM | POA: Diagnosis not present

## 2021-08-19 DIAGNOSIS — J449 Chronic obstructive pulmonary disease, unspecified: Secondary | ICD-10-CM | POA: Diagnosis not present

## 2021-08-19 DIAGNOSIS — I447 Left bundle-branch block, unspecified: Secondary | ICD-10-CM | POA: Diagnosis not present

## 2021-08-19 DIAGNOSIS — I2510 Atherosclerotic heart disease of native coronary artery without angina pectoris: Secondary | ICD-10-CM | POA: Diagnosis not present

## 2021-08-19 DIAGNOSIS — E7800 Pure hypercholesterolemia, unspecified: Secondary | ICD-10-CM | POA: Diagnosis not present

## 2021-08-19 DIAGNOSIS — F17219 Nicotine dependence, cigarettes, with unspecified nicotine-induced disorders: Secondary | ICD-10-CM | POA: Diagnosis not present

## 2021-08-19 DIAGNOSIS — I10 Essential (primary) hypertension: Secondary | ICD-10-CM | POA: Diagnosis not present

## 2021-08-19 DIAGNOSIS — N321 Vesicointestinal fistula: Secondary | ICD-10-CM | POA: Diagnosis not present

## 2021-09-03 DIAGNOSIS — Z79899 Other long term (current) drug therapy: Secondary | ICD-10-CM | POA: Diagnosis not present

## 2021-09-03 DIAGNOSIS — N321 Vesicointestinal fistula: Secondary | ICD-10-CM | POA: Diagnosis not present

## 2021-09-03 DIAGNOSIS — Z881 Allergy status to other antibiotic agents status: Secondary | ICD-10-CM | POA: Diagnosis not present

## 2021-09-03 DIAGNOSIS — K660 Peritoneal adhesions (postprocedural) (postinfection): Secondary | ICD-10-CM | POA: Diagnosis not present

## 2021-09-03 DIAGNOSIS — E785 Hyperlipidemia, unspecified: Secondary | ICD-10-CM | POA: Diagnosis not present

## 2021-09-03 DIAGNOSIS — G8918 Other acute postprocedural pain: Secondary | ICD-10-CM | POA: Diagnosis not present

## 2021-09-03 DIAGNOSIS — M19072 Primary osteoarthritis, left ankle and foot: Secondary | ICD-10-CM | POA: Diagnosis not present

## 2021-09-03 DIAGNOSIS — I7121 Aneurysm of the ascending aorta, without rupture: Secondary | ICD-10-CM | POA: Diagnosis not present

## 2021-09-03 DIAGNOSIS — I447 Left bundle-branch block, unspecified: Secondary | ICD-10-CM | POA: Diagnosis not present

## 2021-09-03 DIAGNOSIS — J45909 Unspecified asthma, uncomplicated: Secondary | ICD-10-CM | POA: Diagnosis not present

## 2021-09-03 DIAGNOSIS — I2510 Atherosclerotic heart disease of native coronary artery without angina pectoris: Secondary | ICD-10-CM | POA: Diagnosis not present

## 2021-09-03 DIAGNOSIS — M5116 Intervertebral disc disorders with radiculopathy, lumbar region: Secondary | ICD-10-CM | POA: Diagnosis not present

## 2021-09-03 DIAGNOSIS — Z791 Long term (current) use of non-steroidal anti-inflammatories (NSAID): Secondary | ICD-10-CM | POA: Diagnosis not present

## 2021-09-03 DIAGNOSIS — I10 Essential (primary) hypertension: Secondary | ICD-10-CM | POA: Diagnosis not present

## 2021-09-03 DIAGNOSIS — M19071 Primary osteoarthritis, right ankle and foot: Secondary | ICD-10-CM | POA: Diagnosis not present

## 2021-09-03 DIAGNOSIS — M19041 Primary osteoarthritis, right hand: Secondary | ICD-10-CM | POA: Diagnosis not present

## 2021-09-03 DIAGNOSIS — K5730 Diverticulosis of large intestine without perforation or abscess without bleeding: Secondary | ICD-10-CM | POA: Diagnosis not present

## 2021-09-03 DIAGNOSIS — F17210 Nicotine dependence, cigarettes, uncomplicated: Secondary | ICD-10-CM | POA: Diagnosis not present

## 2021-09-03 DIAGNOSIS — K219 Gastro-esophageal reflux disease without esophagitis: Secondary | ICD-10-CM | POA: Diagnosis not present

## 2021-09-03 DIAGNOSIS — F17200 Nicotine dependence, unspecified, uncomplicated: Secondary | ICD-10-CM | POA: Diagnosis not present

## 2021-09-03 DIAGNOSIS — K5732 Diverticulitis of large intestine without perforation or abscess without bleeding: Secondary | ICD-10-CM | POA: Diagnosis not present

## 2021-09-03 DIAGNOSIS — M19042 Primary osteoarthritis, left hand: Secondary | ICD-10-CM | POA: Diagnosis not present

## 2021-09-03 DIAGNOSIS — J449 Chronic obstructive pulmonary disease, unspecified: Secondary | ICD-10-CM | POA: Diagnosis not present

## 2021-09-03 DIAGNOSIS — K5792 Diverticulitis of intestine, part unspecified, without perforation or abscess without bleeding: Secondary | ICD-10-CM | POA: Diagnosis not present

## 2021-09-03 DIAGNOSIS — L409 Psoriasis, unspecified: Secondary | ICD-10-CM | POA: Diagnosis not present

## 2021-09-03 DIAGNOSIS — Z7982 Long term (current) use of aspirin: Secondary | ICD-10-CM | POA: Diagnosis not present

## 2021-09-08 DIAGNOSIS — N321 Vesicointestinal fistula: Secondary | ICD-10-CM | POA: Diagnosis not present

## 2021-09-19 DIAGNOSIS — D126 Benign neoplasm of colon, unspecified: Secondary | ICD-10-CM | POA: Diagnosis not present

## 2021-09-19 DIAGNOSIS — I10 Essential (primary) hypertension: Secondary | ICD-10-CM | POA: Diagnosis not present

## 2021-09-19 DIAGNOSIS — R197 Diarrhea, unspecified: Secondary | ICD-10-CM | POA: Diagnosis not present

## 2021-09-19 DIAGNOSIS — N321 Vesicointestinal fistula: Secondary | ICD-10-CM | POA: Diagnosis not present

## 2021-10-06 ENCOUNTER — Encounter (INDEPENDENT_AMBULATORY_CARE_PROVIDER_SITE_OTHER): Payer: Medicare Other | Admitting: Sports Medicine

## 2021-10-06 DIAGNOSIS — M545 Low back pain, unspecified: Secondary | ICD-10-CM | POA: Diagnosis not present

## 2021-10-06 DIAGNOSIS — G8929 Other chronic pain: Secondary | ICD-10-CM

## 2021-10-06 NOTE — Telephone Encounter (Signed)
As I spent 5 total minutes of online digital evaluation and management services in this patient-initiated request for online care.

## 2021-10-15 ENCOUNTER — Encounter: Payer: Self-pay | Admitting: General Practice

## 2021-10-19 ENCOUNTER — Other Ambulatory Visit: Payer: Self-pay | Admitting: Sports Medicine

## 2021-10-19 DIAGNOSIS — M19042 Primary osteoarthritis, left hand: Secondary | ICD-10-CM

## 2021-10-22 DIAGNOSIS — I5022 Chronic systolic (congestive) heart failure: Secondary | ICD-10-CM | POA: Diagnosis not present

## 2021-10-22 DIAGNOSIS — I2510 Atherosclerotic heart disease of native coronary artery without angina pectoris: Secondary | ICD-10-CM | POA: Diagnosis not present

## 2021-10-22 DIAGNOSIS — I739 Peripheral vascular disease, unspecified: Secondary | ICD-10-CM | POA: Diagnosis not present

## 2021-10-22 DIAGNOSIS — I10 Essential (primary) hypertension: Secondary | ICD-10-CM | POA: Diagnosis not present

## 2021-11-03 ENCOUNTER — Telehealth: Payer: Self-pay

## 2021-11-03 DIAGNOSIS — M5416 Radiculopathy, lumbar region: Secondary | ICD-10-CM

## 2021-11-03 NOTE — Telephone Encounter (Signed)
Patient called to request that we go ahead an order the Mri. He would prefer to stay in [place] for the scan.  He wanted to let you know that he had a CT prior to surgery at [facility] that had some findings with the spine and hips. He asked that you review this. He has been seeing his cardiologist and has follow up testing to assess a AAA and EF of 40%.

## 2021-11-03 NOTE — Telephone Encounter (Signed)
X-ray and MRI ordered

## 2021-11-13 DIAGNOSIS — I739 Peripheral vascular disease, unspecified: Secondary | ICD-10-CM | POA: Diagnosis not present

## 2021-11-15 ENCOUNTER — Ambulatory Visit (INDEPENDENT_AMBULATORY_CARE_PROVIDER_SITE_OTHER): Payer: Medicare Other

## 2021-11-15 DIAGNOSIS — M545 Low back pain, unspecified: Secondary | ICD-10-CM | POA: Diagnosis not present

## 2021-11-15 DIAGNOSIS — M5416 Radiculopathy, lumbar region: Secondary | ICD-10-CM | POA: Diagnosis not present

## 2021-11-18 ENCOUNTER — Encounter (INDEPENDENT_AMBULATORY_CARE_PROVIDER_SITE_OTHER): Payer: Medicare Other | Admitting: Sports Medicine

## 2021-11-18 DIAGNOSIS — M5416 Radiculopathy, lumbar region: Secondary | ICD-10-CM | POA: Diagnosis not present

## 2021-11-19 NOTE — Assessment & Plan Note (Signed)
MRI shows multilevel spondylosis, for the most part mild, dominant finding is a broad-based L4-L5 disc protrusion causing mild central and moderate bilateral foraminal stenosis. Proceeding with right L4-L5 interlaminar epidural. Return to see me 4 to 6 weeks after the injection.

## 2021-11-19 NOTE — Telephone Encounter (Signed)
I spent 5 total minutes of online digital evaluation and management services in this patient-initiated request for online care. 

## 2021-12-01 ENCOUNTER — Ambulatory Visit
Admission: RE | Admit: 2021-12-01 | Discharge: 2021-12-01 | Disposition: A | Discharge status: Home / Self Care | Payer: Medicare Other | Source: Ambulatory Visit | Attending: Sports Medicine | Admitting: Sports Medicine

## 2021-12-01 DIAGNOSIS — M47817 Spondylosis without myelopathy or radiculopathy, lumbosacral region: Secondary | ICD-10-CM | POA: Diagnosis not present

## 2021-12-01 DIAGNOSIS — M5416 Radiculopathy, lumbar region: Secondary | ICD-10-CM

## 2021-12-01 DIAGNOSIS — M5116 Intervertebral disc disorders with radiculopathy, lumbar region: Secondary | ICD-10-CM | POA: Diagnosis not present

## 2021-12-01 MED ORDER — IOPAMIDOL (ISOVUE-M 200) INJECTION 41%
1.0000 mL | Freq: Once | INTRAMUSCULAR | Status: AC
  Administered 2021-12-01: 1 mL via EPIDURAL

## 2021-12-01 MED ORDER — METHYLPREDNISOLONE ACETATE 40 MG/ML INJ SUSP (RADIOLOG
80.0000 mg | Freq: Once | INTRAMUSCULAR | Status: AC
  Administered 2021-12-01: 80 mg via EPIDURAL

## 2021-12-01 NOTE — Discharge Instructions (Signed)

## 2021-12-08 DIAGNOSIS — I872 Venous insufficiency (chronic) (peripheral): Secondary | ICD-10-CM | POA: Diagnosis not present

## 2021-12-09 ENCOUNTER — Encounter: Payer: Self-pay | Admitting: Sports Medicine

## 2021-12-16 DIAGNOSIS — J8410 Pulmonary fibrosis, unspecified: Secondary | ICD-10-CM | POA: Diagnosis not present

## 2021-12-16 DIAGNOSIS — I7121 Aneurysm of the ascending aorta, without rupture: Secondary | ICD-10-CM | POA: Diagnosis not present

## 2021-12-16 DIAGNOSIS — R079 Chest pain, unspecified: Secondary | ICD-10-CM | POA: Diagnosis not present

## 2021-12-17 ENCOUNTER — Ambulatory Visit (INDEPENDENT_AMBULATORY_CARE_PROVIDER_SITE_OTHER): Payer: Medicare Other

## 2021-12-17 ENCOUNTER — Ambulatory Visit (INDEPENDENT_AMBULATORY_CARE_PROVIDER_SITE_OTHER): Payer: Medicare Other | Admitting: Sports Medicine

## 2021-12-17 DIAGNOSIS — L409 Psoriasis, unspecified: Secondary | ICD-10-CM | POA: Diagnosis not present

## 2021-12-17 DIAGNOSIS — I739 Peripheral vascular disease, unspecified: Secondary | ICD-10-CM

## 2021-12-17 DIAGNOSIS — M160 Bilateral primary osteoarthritis of hip: Secondary | ICD-10-CM | POA: Diagnosis not present

## 2021-12-17 DIAGNOSIS — M19071 Primary osteoarthritis, right ankle and foot: Secondary | ICD-10-CM

## 2021-12-17 MED ORDER — CALCIPOTRIENE-BETAMETH DIPROP 0.005-0.064 % EX OINT: TOPICAL_OINTMENT | Freq: Every day | CUTANEOUS | 11 refills | Status: AC

## 2021-12-17 NOTE — Assessment & Plan Note (Signed)
Chronic bilateral hip pain, anterior, good motion but we did reproduce pain with the FADIR sign. Bilateral hip DJD seen on a CT abdomen and pelvis from an outside facility. Suspect he likely has a degenerative labral tear as well, we did a right hip joint injection for diagnostic and therapeutic purposes today. Return to see me in 6 weeks to reevaluate.

## 2021-12-17 NOTE — Assessment & Plan Note (Signed)
Needs a refill on Taclonex.

## 2021-12-17 NOTE — Assessment & Plan Note (Signed)
Does have complaints of a heaviness throughout the lower extremities but not in the report of the classical claudication. He does have significant lumbar disease albeit mild.   Epidural was ineffective. Multiple areas of arterial stenosis based on an ABI. Results are available through care everywhere, further management per vascular surgery.

## 2021-12-17 NOTE — Assessment & Plan Note (Signed)
Widespread osteoarthritis, oral medications not effective, pain today is predominantly at the right calcaneocuboid joint, this was injected today with ultrasound guidance.

## 2021-12-17 NOTE — Progress Notes (Signed)
    Procedures performed today:    Procedure: Real-time Ultrasound Guided injection of the right hip joint Device: Samsung HS60  Verbal informed consent obtained.  Time-out conducted.  Noted no overlying erythema, induration, or other signs of local infection.  Skin prepped in a sterile fashion.  Local anesthesia: Topical Ethyl chloride.  With sterile technique and under real time ultrasound guidance: Arthritic joint noted, 1 cc Kenalog 40, 2 cc lidocaine, 2 cc bupivacaine injected easily Completed without difficulty  Advised to call if fevers/chills, erythema, induration, drainage, or persistent bleeding.  Images permanently stored and available for review in PACS.  Impression: Technically successful ultrasound guided injection.  Procedure: Real-time Ultrasound Guided injection of the right calcaneocuboid joint. Device: Samsung HS60  Verbal informed consent obtained.  Time-out conducted.  Noted no overlying erythema, induration, or other signs of local infection.  Skin prepped in a sterile fashion.  Local anesthesia: Topical Ethyl chloride.  With sterile technique and under real time ultrasound guidance: Arthritic joint noted, 1 cc kenalog 40, 1 cc lidocaine injected easily. Completed without difficulty  Advised to call if fevers/chills, erythema, induration, drainage, or persistent bleeding.  Images permanently stored and available for review in PACS.  Impression: Technically successful ultrasound guided injection.  Independent interpretation of notes and tests performed by another provider:   None.  Brief History, Exam, Impression, and Recommendations:    Primary osteoarthritis of both hips Chronic bilateral hip pain, anterior, good motion but we did reproduce pain with the FADIR sign. Bilateral hip DJD seen on a CT abdomen and pelvis from an outside facility. Suspect he likely has a degenerative labral tear as well, we did a right hip joint injection for diagnostic and  therapeutic purposes today. Return to see me in 6 weeks to reevaluate.  Peripheral arterial disease ([place]) Does have complaints of a heaviness throughout the lower extremities but not in the report of the classical claudication. He does have significant lumbar disease albeit mild.   Epidural was ineffective. Multiple areas of arterial stenosis based on an ABI. Results are available through care everywhere, further management per vascular surgery.   Primary osteoarthritis of right foot Widespread osteoarthritis, oral medications not effective, pain today is predominantly at the right calcaneocuboid joint, this was injected today with ultrasound guidance.  Psoriasis Needs a refill on Taclonex.    ____________________________________________ [name]. Dianah Field, M.D., ABFM., CAQSM., AME. Primary Care and Sports Medicine  [facility]  Adjunct Professor of [place] of [facility] of Medicine  Risk manager

## 2021-12-18 DIAGNOSIS — E7800 Pure hypercholesterolemia, unspecified: Secondary | ICD-10-CM | POA: Diagnosis not present

## 2021-12-18 DIAGNOSIS — I10 Essential (primary) hypertension: Secondary | ICD-10-CM | POA: Diagnosis not present

## 2021-12-18 DIAGNOSIS — I739 Peripheral vascular disease, unspecified: Secondary | ICD-10-CM | POA: Diagnosis not present

## 2021-12-18 DIAGNOSIS — I70212 Atherosclerosis of native arteries of extremities with intermittent claudication, left leg: Secondary | ICD-10-CM | POA: Diagnosis not present

## 2021-12-18 DIAGNOSIS — I447 Left bundle-branch block, unspecified: Secondary | ICD-10-CM | POA: Diagnosis not present

## 2021-12-25 DIAGNOSIS — I10 Essential (primary) hypertension: Secondary | ICD-10-CM | POA: Diagnosis not present

## 2021-12-25 DIAGNOSIS — I70213 Atherosclerosis of native arteries of extremities with intermittent claudication, bilateral legs: Secondary | ICD-10-CM | POA: Diagnosis not present

## 2021-12-25 DIAGNOSIS — I739 Peripheral vascular disease, unspecified: Secondary | ICD-10-CM | POA: Diagnosis not present

## 2021-12-25 DIAGNOSIS — I70212 Atherosclerosis of native arteries of extremities with intermittent claudication, left leg: Secondary | ICD-10-CM | POA: Diagnosis not present

## 2022-01-09 DIAGNOSIS — J449 Chronic obstructive pulmonary disease, unspecified: Secondary | ICD-10-CM | POA: Diagnosis not present

## 2022-01-09 DIAGNOSIS — I10 Essential (primary) hypertension: Secondary | ICD-10-CM | POA: Diagnosis not present

## 2022-01-09 DIAGNOSIS — Z7982 Long term (current) use of aspirin: Secondary | ICD-10-CM | POA: Diagnosis not present

## 2022-01-09 DIAGNOSIS — I7121 Aneurysm of the ascending aorta, without rupture: Secondary | ICD-10-CM | POA: Diagnosis not present

## 2022-01-09 DIAGNOSIS — Z791 Long term (current) use of non-steroidal anti-inflammatories (NSAID): Secondary | ICD-10-CM | POA: Diagnosis not present

## 2022-01-09 DIAGNOSIS — M1990 Unspecified osteoarthritis, unspecified site: Secondary | ICD-10-CM | POA: Diagnosis not present

## 2022-01-09 DIAGNOSIS — I70212 Atherosclerosis of native arteries of extremities with intermittent claudication, left leg: Secondary | ICD-10-CM | POA: Diagnosis not present

## 2022-01-09 DIAGNOSIS — I447 Left bundle-branch block, unspecified: Secondary | ICD-10-CM | POA: Diagnosis not present

## 2022-01-09 DIAGNOSIS — I2510 Atherosclerotic heart disease of native coronary artery without angina pectoris: Secondary | ICD-10-CM | POA: Diagnosis not present

## 2022-01-09 DIAGNOSIS — Z79899 Other long term (current) drug therapy: Secondary | ICD-10-CM | POA: Diagnosis not present

## 2022-01-09 DIAGNOSIS — I739 Peripheral vascular disease, unspecified: Secondary | ICD-10-CM | POA: Diagnosis not present

## 2022-01-09 DIAGNOSIS — I70202 Unspecified atherosclerosis of native arteries of extremities, left leg: Secondary | ICD-10-CM | POA: Diagnosis not present

## 2022-01-09 DIAGNOSIS — E785 Hyperlipidemia, unspecified: Secondary | ICD-10-CM | POA: Diagnosis not present

## 2022-01-09 DIAGNOSIS — Z961 Presence of intraocular lens: Secondary | ICD-10-CM | POA: Diagnosis not present

## 2022-01-09 DIAGNOSIS — F17210 Nicotine dependence, cigarettes, uncomplicated: Secondary | ICD-10-CM | POA: Diagnosis not present

## 2022-01-09 DIAGNOSIS — K219 Gastro-esophageal reflux disease without esophagitis: Secondary | ICD-10-CM | POA: Diagnosis not present

## 2022-01-09 DIAGNOSIS — Z881 Allergy status to other antibiotic agents status: Secondary | ICD-10-CM | POA: Diagnosis not present

## 2022-01-09 DIAGNOSIS — M5136 Other intervertebral disc degeneration, lumbar region: Secondary | ICD-10-CM | POA: Diagnosis not present

## 2022-01-09 DIAGNOSIS — I708 Atherosclerosis of other arteries: Secondary | ICD-10-CM | POA: Diagnosis not present

## 2022-01-19 ENCOUNTER — Encounter: Payer: Self-pay | Admitting: Sports Medicine

## 2022-01-19 ENCOUNTER — Ambulatory Visit (INDEPENDENT_AMBULATORY_CARE_PROVIDER_SITE_OTHER): Payer: Medicare Other | Admitting: Sports Medicine

## 2022-01-19 DIAGNOSIS — D509 Iron deficiency anemia, unspecified: Secondary | ICD-10-CM | POA: Diagnosis not present

## 2022-01-19 DIAGNOSIS — F17200 Nicotine dependence, unspecified, uncomplicated: Secondary | ICD-10-CM | POA: Diagnosis not present

## 2022-01-19 DIAGNOSIS — E785 Hyperlipidemia, unspecified: Secondary | ICD-10-CM | POA: Diagnosis not present

## 2022-01-19 DIAGNOSIS — M48062 Spinal stenosis, lumbar region with neurogenic claudication: Secondary | ICD-10-CM

## 2022-01-19 DIAGNOSIS — I10 Essential (primary) hypertension: Secondary | ICD-10-CM | POA: Diagnosis not present

## 2022-01-19 DIAGNOSIS — D649 Anemia, unspecified: Secondary | ICD-10-CM

## 2022-01-19 DIAGNOSIS — I872 Venous insufficiency (chronic) (peripheral): Secondary | ICD-10-CM | POA: Diagnosis not present

## 2022-01-19 MED ORDER — VARENICLINE TARTRATE 1 MG PO TABS: 1.0000 mg | ORAL_TABLET | Freq: Two times a day (BID) | ORAL | 3 refills | Status: DC

## 2022-01-19 MED ORDER — VARENICLINE TARTRATE (STARTER) 0.5 MG X 11 & 1 MG X 42 PO TBPK: ORAL_TABLET | ORAL | 0 refills | Status: DC

## 2022-01-19 NOTE — Assessment & Plan Note (Signed)
Holding atorvastatin to see if this helps his aches and pains, we can recheck his lipids tomorrow and then he will stop his atorvastatin. If this does improve his pain we can continue to hold off of atorvastatin and use Crestor instead, if this does not help then we will try Repatha.

## 2022-01-19 NOTE — Assessment & Plan Note (Signed)
Improved with furosemide however having increasing orthostasis, we will switch back to lower extremity compression stockings and discontinue Lasix for now.

## 2022-01-19 NOTE — Assessment & Plan Note (Signed)
Keith Moses continues to have low back pain, lower pelvic pain with radiation down the left leg, cramping in his legs with activity. He is post angioplasty but has not noticed any improvement in his claudication symptoms. Lumbar spine MRI does show mild to moderate lumbar spinal stenosis at L4-L5, multifactorial. On further questioning he has noted improvements when he does take gabapentin but is only taking it approximately once per day. In the spirit of ruling out statin induced myopathy he will hold off on his atorvastatin for about a month and if he has improvement in his pain we will consider a different way to control his lipids, if no improvements in his pain he will restart atorvastatin and we will do a titration up to 3 times a day on his gabapentin. Of note an interlaminar epidural at L4-L5 provided mild relief.

## 2022-01-19 NOTE — Assessment & Plan Note (Signed)
Adding Chantix

## 2022-01-19 NOTE — Patient Instructions (Signed)
HOLD ATORVASTATIN FOR NOW

## 2022-01-19 NOTE — Assessment & Plan Note (Signed)
Orthostatic with Lasix, discontinue this and we will get his lower extremity edema under control with compression stockings.

## 2022-01-19 NOTE — Progress Notes (Addendum)
    Procedures performed today:    None.  Independent interpretation of notes and tests performed by another provider:   None.  Brief History, Exam, Impression, and Recommendations:    Lumbar spinal stenosis Keith Moses continues to have low back pain, lower pelvic pain with radiation down the left leg, cramping in his legs with activity. He is post angioplasty but has not noticed any improvement in his claudication symptoms. Lumbar spine MRI does show mild to moderate lumbar spinal stenosis at L4-L5, multifactorial. On further questioning he has noted improvements when he does take gabapentin but is only taking it approximately once per day. In the spirit of ruling out statin induced myopathy he will hold off on his atorvastatin for about a month and if he has improvement in his pain we will consider a different way to control his lipids, if no improvements in his pain he will restart atorvastatin and we will do a titration up to 3 times a day on his gabapentin. Of note an interlaminar epidural at L4-L5 provided mild relief.  Smoker Adding Chantix  Venous stasis dermatitis of both lower extremities Improved with furosemide however having increasing orthostasis, we will switch back to lower extremity compression stockings and discontinue Lasix for now.  Essential hypertension, benign Orthostatic with Lasix, discontinue this and we will get his lower extremity edema under control with compression stockings.  Hyperlipidemia Holding atorvastatin to see if this helps his aches and pains, we can recheck his lipids tomorrow and then he will stop his atorvastatin. If this does improve his pain we can continue to hold off of atorvastatin and use Crestor instead, if this does not help then we will try Repatha.  Iron deficiency anemia Unclear etiology, adding iron indices and anemia panel.  Update: Iron indices suggest iron deficiency.  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  He is also followed by gastroenterology.  Adding aggressive iron supplementation.  We can revisit this in 1 to 2 months.  I spent 30 minutes of total time managing this patient today, this includes chart review, face to face, and non-face to face time.  ____________________________________________ [name]. Dianah Field, M.D., ABFM., CAQSM., AME. Primary Care and Sports Medicine [facility]  Adjunct Professor of [place] [employer] [facility] of Medicine  Risk manager

## 2022-01-20 DIAGNOSIS — E785 Hyperlipidemia, unspecified: Secondary | ICD-10-CM | POA: Diagnosis not present

## 2022-01-20 DIAGNOSIS — R7309 Other abnormal glucose: Secondary | ICD-10-CM | POA: Diagnosis not present

## 2022-01-21 DIAGNOSIS — D509 Iron deficiency anemia, unspecified: Secondary | ICD-10-CM | POA: Insufficient documentation

## 2022-01-21 DIAGNOSIS — D649 Anemia, unspecified: Secondary | ICD-10-CM | POA: Insufficient documentation

## 2022-01-21 LAB — LIPID PANEL
Cholesterol: 191 mg/dL (ref <200)
HDL: 78 mg/dL (ref >=40)
LDL Cholesterol (Calc): 100 mg/dL (calc) — ABNORMAL HIGH
Non-HDL Cholesterol (Calc): 113 mg/dL (calc) (ref <130)
Total CHOL/HDL Ratio: 2.4 (calc) (ref <5.0)
Triglycerides: 52 mg/dL (ref <150)

## 2022-01-21 LAB — TSH: TSH: 1.58 mIU/L (ref 0.40–4.50)

## 2022-01-21 LAB — CBC
HCT: 34.1 % — ABNORMAL LOW (ref 38.5–50.0)
Hemoglobin: 11.5 g/dL — ABNORMAL LOW (ref 13.2–17.1)
MCH: 32.6 pg (ref 27.0–33.0)
MCHC: 33.7 g/dL (ref 32.0–36.0)
MCV: 96.6 fL (ref 80.0–100.0)
MPV: 8.7 fL (ref 7.5–12.5)
Platelets: 281 10*3/uL (ref 140–400)
RBC: 3.53 10*6/uL — ABNORMAL LOW (ref 4.20–5.80)
RDW: 13 % (ref 11.0–15.0)
WBC: 5.5 10*3/uL (ref 3.8–10.8)

## 2022-01-21 LAB — COMPLETE METABOLIC PANEL WITH GFR
AG Ratio: 1.5 (calc) (ref 1.0–2.5)
ALT: 11 U/L (ref 9–46)
AST: 19 U/L (ref 10–35)
Albumin: 4 g/dL (ref 3.6–5.1)
Alkaline phosphatase (APISO): 77 U/L (ref 35–144)
BUN: 24 mg/dL (ref 7–25)
CO2: 23 mmol/L (ref 20–32)
Calcium: 9.1 mg/dL (ref 8.6–10.3)
Chloride: 108 mmol/L (ref 98–110)
Creat: 0.92 mg/dL (ref 0.70–1.28)
Globulin: 2.6 g/dL (calc) (ref 1.9–3.7)
Glucose, Bld: 89 mg/dL (ref 65–99)
Potassium: 4.1 mmol/L (ref 3.5–5.3)
Sodium: 139 mmol/L (ref 135–146)
Total Bilirubin: 0.4 mg/dL (ref 0.2–1.2)
Total Protein: 6.6 g/dL (ref 6.1–8.1)
eGFR: 89 mL/min/{1.73_m2} (ref >=60)

## 2022-01-21 LAB — HEMOGLOBIN A1C
Hgb A1c MFr Bld: 5.8 % of total Hgb — ABNORMAL HIGH (ref <5.7)
Mean Plasma Glucose: 120 mg/dL
eAG (mmol/L): 6.6 mmol/L

## 2022-01-21 NOTE — Addendum Note (Signed)
Addended by: Silverio Decamp on: 01/21/2022 10:48 AM   Modules accepted: Orders

## 2022-01-21 NOTE — Assessment & Plan Note (Addendum)
Unclear etiology, adding iron indices and anemia panel.  Update: Iron indices suggest iron deficiency.  He is status post sigmoid colectomy for colovesicular fistula back in July.  He is also followed by gastroenterology.  Adding aggressive iron supplementation.  We can revisit this in 1 to 2 months.

## 2022-01-22 DIAGNOSIS — L700 Acne vulgaris: Secondary | ICD-10-CM | POA: Diagnosis not present

## 2022-01-22 DIAGNOSIS — L570 Actinic keratosis: Secondary | ICD-10-CM | POA: Diagnosis not present

## 2022-01-22 DIAGNOSIS — L400 Psoriasis vulgaris: Secondary | ICD-10-CM | POA: Diagnosis not present

## 2022-01-26 DIAGNOSIS — Z48812 Encounter for surgical aftercare following surgery on the circulatory system: Secondary | ICD-10-CM | POA: Diagnosis not present

## 2022-01-26 DIAGNOSIS — M7989 Other specified soft tissue disorders: Secondary | ICD-10-CM | POA: Diagnosis not present

## 2022-01-26 DIAGNOSIS — I739 Peripheral vascular disease, unspecified: Secondary | ICD-10-CM | POA: Diagnosis not present

## 2022-01-27 ENCOUNTER — Encounter: Payer: Self-pay | Admitting: Sports Medicine

## 2022-01-28 ENCOUNTER — Ambulatory Visit: Payer: Medicare Other | Admitting: Sports Medicine

## 2022-01-30 ENCOUNTER — Ambulatory Visit (INDEPENDENT_AMBULATORY_CARE_PROVIDER_SITE_OTHER): Payer: Medicare Other | Admitting: Family Medicine

## 2022-01-30 DIAGNOSIS — Z23 Encounter for immunization: Secondary | ICD-10-CM

## 2022-01-30 DIAGNOSIS — D649 Anemia, unspecified: Secondary | ICD-10-CM | POA: Diagnosis not present

## 2022-01-30 NOTE — Progress Notes (Signed)
Patient came in today for labs and a influenza shot and patient was given 65+ older vaccine due to being 6 and was given right Deltoid and patient tolerated well.

## 2022-01-30 NOTE — Telephone Encounter (Signed)
I called [facility] and they never got the orders so I called patient and he will come in and get labs redrawn.

## 2022-01-31 LAB — B12 AND FOLATE PANEL
Folate: >24 ng/mL
Vitamin B-12: 431 pg/mL (ref 200–1100)

## 2022-01-31 LAB — IRON,TIBC AND FERRITIN PANEL
%SAT: 16 % (calc) — ABNORMAL LOW (ref 20–48)
Ferritin: 57 ng/mL (ref 24–380)
Iron: 49 ug/dL — ABNORMAL LOW (ref 50–180)
TIBC: 311 mcg/dL (calc) (ref 250–425)

## 2022-01-31 LAB — RETICULOCYTES
ABS Retic: 53100 cells/uL (ref 25000–90000)
Retic Ct Pct: 1.5 %

## 2022-02-01 ENCOUNTER — Encounter: Payer: Self-pay | Admitting: Sports Medicine

## 2022-02-01 MED ORDER — FERROUS SULFATE 325 (65 FE) MG PO TBEC: 325.0000 mg | DELAYED_RELEASE_TABLET | Freq: Three times a day (TID) | ORAL | 11 refills | Status: DC

## 2022-02-01 NOTE — Addendum Note (Signed)
Addended by: Silverio Decamp on: 02/01/2022 09:24 AM   Modules accepted: Orders

## 2022-02-12 DIAGNOSIS — I739 Peripheral vascular disease, unspecified: Secondary | ICD-10-CM | POA: Diagnosis not present

## 2022-02-12 DIAGNOSIS — Z48812 Encounter for surgical aftercare following surgery on the circulatory system: Secondary | ICD-10-CM | POA: Diagnosis not present

## 2022-02-19 ENCOUNTER — Telehealth: Payer: Self-pay

## 2022-02-19 DIAGNOSIS — M48062 Spinal stenosis, lumbar region with neurogenic claudication: Secondary | ICD-10-CM

## 2022-02-19 NOTE — Telephone Encounter (Signed)
Patient left VM and states you stopped atorvastatin and he is still having pain and and he states you talked about him seeing a back doctor and he wanted to get more info on seeing the back doctor and in your note it states you will restart his atorvastatin please advise.

## 2022-02-20 NOTE — Telephone Encounter (Signed)
Okay yes, please restart atorvastatin and I can get him referred out to [place] [facility]

## 2022-02-20 NOTE — Addendum Note (Signed)
Addended by: Silverio Decamp on: 02/20/2022 09:37 AM   Modules accepted: Orders

## 2022-02-20 NOTE — Telephone Encounter (Signed)
Patient notified

## 2022-02-24 ENCOUNTER — Encounter: Payer: Self-pay | Admitting: Sports Medicine

## 2022-02-24 ENCOUNTER — Telehealth: Payer: Self-pay | Admitting: Sports Medicine

## 2022-02-24 NOTE — Telephone Encounter (Addendum)
If you are referring to neurosurgery it will be downstairs most likely or the [facility] location

## 2022-02-24 NOTE — Telephone Encounter (Signed)
Patient called in regards to referral he asked which facility he would be going to he forgot the name please contact patient .

## 2022-02-26 DIAGNOSIS — H524 Presbyopia: Secondary | ICD-10-CM | POA: Diagnosis not present

## 2022-03-03 ENCOUNTER — Other Ambulatory Visit: Payer: Self-pay | Admitting: Neurosurgery

## 2022-03-03 DIAGNOSIS — M4802 Spinal stenosis, cervical region: Secondary | ICD-10-CM | POA: Diagnosis not present

## 2022-03-03 DIAGNOSIS — M5126 Other intervertebral disc displacement, lumbar region: Secondary | ICD-10-CM | POA: Diagnosis not present

## 2022-03-07 ENCOUNTER — Ambulatory Visit (INDEPENDENT_AMBULATORY_CARE_PROVIDER_SITE_OTHER): Payer: Medicare Other

## 2022-03-07 DIAGNOSIS — R202 Paresthesia of skin: Secondary | ICD-10-CM | POA: Diagnosis not present

## 2022-03-07 DIAGNOSIS — M542 Cervicalgia: Secondary | ICD-10-CM | POA: Diagnosis not present

## 2022-03-07 DIAGNOSIS — M4802 Spinal stenosis, cervical region: Secondary | ICD-10-CM | POA: Diagnosis not present

## 2022-03-13 ENCOUNTER — Ambulatory Visit (INDEPENDENT_AMBULATORY_CARE_PROVIDER_SITE_OTHER): Payer: Medicare Other | Admitting: Sports Medicine

## 2022-03-13 DIAGNOSIS — M48062 Spinal stenosis, lumbar region with neurogenic claudication: Secondary | ICD-10-CM

## 2022-03-13 DIAGNOSIS — D509 Iron deficiency anemia, unspecified: Secondary | ICD-10-CM

## 2022-03-13 DIAGNOSIS — J440 Chronic obstructive pulmonary disease with acute lower respiratory infection: Secondary | ICD-10-CM

## 2022-03-13 DIAGNOSIS — J209 Acute bronchitis, unspecified: Secondary | ICD-10-CM

## 2022-03-13 MED ORDER — HYDROCOD POLI-CHLORPHE POLI ER 10-8 MG/5ML PO SUER: 5.0000 mL | Freq: Two times a day (BID) | ORAL | 0 refills | Status: DC | PRN

## 2022-03-13 NOTE — Assessment & Plan Note (Signed)
Iron deficiency anemia on iron indices. He is status post sigmoid colectomy for colovesicular fistula back in July. He is also followed by gastroenterology. We will recheck his iron indices today and if normalized he can discontinue iron, if not normalized he will need to touch base with gastroenterology.

## 2022-03-13 NOTE — Assessment & Plan Note (Signed)
Continues to have axial low back pain with radiation down both legs right worse than left with numbness and tingling in the right foot, he only had a minimal response to an L4-L5 interlaminar epidural, Neurontin 3 times a day has showed waning efficacy. Discontinuing atorvastatin did not help. He did see Dr. Christella Noa, he had an abnormal Hoffmann reflex so a cervical spine MRI was ordered, he does not have cord compression or cervical myelopathy though he does have cervical spinal stenosis. He is going to go back to Dr. Christella Noa to discuss surgical intervention in his lumbar spine.

## 2022-03-13 NOTE — Assessment & Plan Note (Signed)
72 year old male, intermittent cough, emphysema on chest CT, he is a smoker. He has not yet started Chantix which I called in for him. We did add Trelegy in the past which he is no longer using. RSV went through his household, he still has a laboring cough although is feeling much better. His bigger issue is insomnia from the cough, I will add some Tussionex for now.

## 2022-03-13 NOTE — Progress Notes (Signed)
    Procedures performed today:    None.  Independent interpretation of notes and tests performed by another provider:   None.  Brief History, Exam, Impression, and Recommendations:    Lumbar spinal stenosis Continues to have axial low back pain with radiation down both legs right worse than left with numbness and tingling in the right foot, he only had a minimal response to an L4-L5 interlaminar epidural, Neurontin 3 times a day has showed waning efficacy. Discontinuing atorvastatin did not help. He did see Dr. Christella Noa, he had an abnormal Hoffmann reflex so a cervical spine MRI was ordered, he does not have cord compression or cervical myelopathy though he does have cervical spinal stenosis. He is going to go back to Dr. Christella Noa to discuss surgical intervention in his lumbar spine.  Iron deficiency anemia Iron deficiency anemia on iron indices. He is status post sigmoid colectomy for colovesicular fistula back in July. He is also followed by gastroenterology. We will recheck his iron indices today and if normalized he can discontinue iron, if not normalized he will need to touch base with gastroenterology.  Acute bronchitis with COPD ([place]) 72 year old male, intermittent cough, emphysema on chest CT, he is a smoker. He has not yet started Chantix which I called in for him. We did add Trelegy in the past which he is no longer using. RSV went through his household, he still has a laboring cough although is feeling much better. His bigger issue is insomnia from the cough, I will add some Tussionex for now.    ____________________________________________ [name]. Dianah Field, M.D., ABFM., CAQSM., AME. Primary Care and Sports Medicine [facility]  Adjunct Professor of [place] of [facility] of Medicine  Risk manager

## 2022-03-14 LAB — IRON,TIBC AND FERRITIN PANEL
%SAT: 21 % (calc) (ref 20–48)
Ferritin: 57 ng/mL (ref 24–380)
Iron: 64 ug/dL (ref 50–180)
TIBC: 310 mcg/dL (calc) (ref 250–425)

## 2022-03-14 LAB — CBC
HCT: 37.8 % — ABNORMAL LOW (ref 38.5–50.0)
Hemoglobin: 13 g/dL — ABNORMAL LOW (ref 13.2–17.1)
MCH: 32.3 pg (ref 27.0–33.0)
MCHC: 34.4 g/dL (ref 32.0–36.0)
MCV: 94 fL (ref 80.0–100.0)
MPV: 9.2 fL (ref 7.5–12.5)
Platelets: 272 10*3/uL (ref 140–400)
RBC: 4.02 10*6/uL — ABNORMAL LOW (ref 4.20–5.80)
RDW: 12.7 % (ref 11.0–15.0)
WBC: 8.3 10*3/uL (ref 3.8–10.8)

## 2022-03-17 DIAGNOSIS — M5126 Other intervertebral disc displacement, lumbar region: Secondary | ICD-10-CM | POA: Diagnosis not present

## 2022-03-17 DIAGNOSIS — M4802 Spinal stenosis, cervical region: Secondary | ICD-10-CM | POA: Diagnosis not present

## 2022-03-19 ENCOUNTER — Encounter: Payer: Self-pay | Admitting: Sports Medicine

## 2022-03-19 DIAGNOSIS — L814 Other melanin hyperpigmentation: Secondary | ICD-10-CM | POA: Diagnosis not present

## 2022-03-19 DIAGNOSIS — L728 Other follicular cysts of the skin and subcutaneous tissue: Secondary | ICD-10-CM | POA: Diagnosis not present

## 2022-03-19 DIAGNOSIS — M48062 Spinal stenosis, lumbar region with neurogenic claudication: Secondary | ICD-10-CM

## 2022-03-19 DIAGNOSIS — D225 Melanocytic nevi of trunk: Secondary | ICD-10-CM | POA: Diagnosis not present

## 2022-03-19 DIAGNOSIS — L219 Seborrheic dermatitis, unspecified: Secondary | ICD-10-CM | POA: Diagnosis not present

## 2022-03-19 DIAGNOSIS — L820 Inflamed seborrheic keratosis: Secondary | ICD-10-CM | POA: Diagnosis not present

## 2022-03-19 DIAGNOSIS — L570 Actinic keratosis: Secondary | ICD-10-CM | POA: Diagnosis not present

## 2022-03-26 ENCOUNTER — Other Ambulatory Visit: Payer: Medicare Other

## 2022-03-31 ENCOUNTER — Encounter: Payer: Self-pay | Admitting: Sports Medicine

## 2022-03-31 DIAGNOSIS — I10 Essential (primary) hypertension: Secondary | ICD-10-CM

## 2022-03-31 MED ORDER — VALSARTAN 160 MG PO TABS: 80.0000 mg | ORAL_TABLET | Freq: Two times a day (BID) | ORAL | 3 refills | Status: DC

## 2022-04-01 ENCOUNTER — Ambulatory Visit
Admission: RE | Admit: 2022-04-01 | Discharge: 2022-04-01 | Disposition: A | Discharge status: Home / Self Care | Payer: Medicare Other | Source: Ambulatory Visit | Attending: Sports Medicine | Admitting: Sports Medicine

## 2022-04-01 DIAGNOSIS — M4727 Other spondylosis with radiculopathy, lumbosacral region: Secondary | ICD-10-CM | POA: Diagnosis not present

## 2022-04-01 DIAGNOSIS — M48062 Spinal stenosis, lumbar region with neurogenic claudication: Secondary | ICD-10-CM

## 2022-04-01 MED ORDER — METHYLPREDNISOLONE ACETATE 40 MG/ML INJ SUSP (RADIOLOG
80.0000 mg | Freq: Once | INTRAMUSCULAR | Status: AC
  Administered 2022-04-01: 80 mg via EPIDURAL

## 2022-04-01 MED ORDER — IOPAMIDOL (ISOVUE-M 200) INJECTION 41%
1.0000 mL | Freq: Once | INTRAMUSCULAR | Status: AC
  Administered 2022-04-01: 1 mL via EPIDURAL

## 2022-04-01 NOTE — Discharge Instructions (Signed)

## 2022-04-16 DIAGNOSIS — M4802 Spinal stenosis, cervical region: Secondary | ICD-10-CM | POA: Diagnosis not present

## 2022-04-16 DIAGNOSIS — M5126 Other intervertebral disc displacement, lumbar region: Secondary | ICD-10-CM | POA: Diagnosis not present

## 2022-04-22 ENCOUNTER — Other Ambulatory Visit: Payer: Self-pay | Admitting: Sports Medicine

## 2022-04-22 DIAGNOSIS — N410 Acute prostatitis: Secondary | ICD-10-CM

## 2022-05-04 DIAGNOSIS — M545 Low back pain, unspecified: Secondary | ICD-10-CM | POA: Diagnosis not present

## 2022-05-06 DIAGNOSIS — M545 Low back pain, unspecified: Secondary | ICD-10-CM | POA: Diagnosis not present

## 2022-05-12 DIAGNOSIS — M545 Low back pain, unspecified: Secondary | ICD-10-CM | POA: Diagnosis not present

## 2022-05-14 DIAGNOSIS — M545 Low back pain, unspecified: Secondary | ICD-10-CM | POA: Diagnosis not present

## 2022-05-19 DIAGNOSIS — M545 Low back pain, unspecified: Secondary | ICD-10-CM | POA: Diagnosis not present

## 2022-05-21 DIAGNOSIS — M545 Low back pain, unspecified: Secondary | ICD-10-CM | POA: Diagnosis not present

## 2022-05-22 ENCOUNTER — Other Ambulatory Visit: Payer: Self-pay | Admitting: Sports Medicine

## 2022-05-22 DIAGNOSIS — M5416 Radiculopathy, lumbar region: Secondary | ICD-10-CM

## 2022-05-26 DIAGNOSIS — M4802 Spinal stenosis, cervical region: Secondary | ICD-10-CM | POA: Diagnosis not present

## 2022-05-26 DIAGNOSIS — M5126 Other intervertebral disc displacement, lumbar region: Secondary | ICD-10-CM | POA: Diagnosis not present

## 2022-05-27 ENCOUNTER — Telehealth: Payer: Self-pay | Admitting: Sports Medicine

## 2022-05-27 NOTE — Telephone Encounter (Signed)
Called patient to schedule Medicare Annual Wellness Visit (AWV). Left message for patient to call back and schedule Medicare Annual Wellness Visit (AWV).  Last date of AWV: 08/06/2020  Please schedule an appointment at any time with [facility].  If any questions, please contact me at [phone].  Thank you ,  Trudi Ida

## 2022-05-28 DIAGNOSIS — M545 Low back pain, unspecified: Secondary | ICD-10-CM | POA: Diagnosis not present

## 2022-06-04 DIAGNOSIS — M545 Low back pain, unspecified: Secondary | ICD-10-CM | POA: Diagnosis not present

## 2022-06-10 ENCOUNTER — Telehealth: Payer: Self-pay | Admitting: General Practice

## 2022-06-10 ENCOUNTER — Ambulatory Visit (INDEPENDENT_AMBULATORY_CARE_PROVIDER_SITE_OTHER): Payer: Medicare Other | Admitting: Sports Medicine

## 2022-06-10 VITALS — BP 128/78 | HR 78 | Ht 76.0 in | Wt 177.0 lb

## 2022-06-10 DIAGNOSIS — Z0000 Encounter for general adult medical examination without abnormal findings: Secondary | ICD-10-CM

## 2022-06-10 DIAGNOSIS — M722 Plantar fascial fibromatosis: Secondary | ICD-10-CM

## 2022-06-10 NOTE — Addendum Note (Signed)
Addended by: Monica Becton on: 06/10/2022 11:42 AM   Modules accepted: Orders

## 2022-06-10 NOTE — Telephone Encounter (Signed)
Patient asked during his medicare wellness visit for a referral for orthotics insole and said that he was referred to Dr. Clare Gandy in the past but did not go at that time.

## 2022-06-10 NOTE — Patient Instructions (Addendum)
MEDICARE ANNUAL WELLNESS VISIT Health Maintenance Summary and Written Plan of Care  [name] ,  Thank you for allowing me to perform your Medicare Annual Wellness Visit and for your ongoing commitment to your health.   Health Maintenance & Immunization History Health Maintenance  Topic Date Due   DTaP/Tdap/Td (3 - Td or Tdap) 01/23/2022   COVID-19 Vaccine (5 - 2023-24 season) 06/26/2022 (Originally 10/24/2021)   Zoster Vaccines- Shingrix (1 of 2) 09/09/2022 (Originally 08/06/1969)   Hepatitis C Screening  06/10/2023 (Originally 08/05/1968)   INFLUENZA VACCINE  09/24/2022   Medicare Annual Wellness (AWV)  06/10/2023   Fecal DNA (Cologuard)  07/18/2023   Pneumonia Vaccine 62+ Years old  Completed   HPV VACCINES  Aged Out   COLONOSCOPY (Pts 45-5yrs Insurance coverage will need to be confirmed)  Discontinued   Immunization History  Administered Date(s) Administered   Fluad Quad(high Dose 65+) 12/28/2018, 11/27/2019, 01/30/2022   Influenza Split 01/06/2012, 01/12/2013   Influenza, High Dose Seasonal PF 01/06/2018   Influenza,inj,Quad PF,6+ Mos 10/20/2013   Influenza-Unspecified 11/23/2012, 11/24/2015, 01/23/2021   PFIZER(Purple Top)SARS-COV-2 Vaccination 04/07/2019, 05/03/2019, 12/23/2019, 12/19/2020   PNEUMOCOCCAL CONJUGATE-20 08/06/2020   Pneumococcal Polysaccharide-23 01/14/2018   Tdap 11/14/2010, 01/24/2012   Zoster, Live 01/10/2016    These are the patient goals that we discussed:  Goals Addressed               This Visit's Progress     Patient Stated (pt-stated)        Patient stated he want to be able to enjoy activities and be pain free.         This is a list of Health Maintenance Items that are overdue or due now: Health Maintenance Due  Topic Date Due   DTaP/Tdap/Td (3 - Td or Tdap) 01/23/2022   Td vaccine Shingles vaccine  Orders/Referrals Placed Today: No orders of the defined types were placed in this encounter.  (Contact our referral department  at [phone] if you have not spoken with someone about your referral appointment within the next 5 days)    Follow-up Plan Follow-up with Monica Becton, MD as planned Schedule shingles and tetanus vaccine at the pharmacy. Medicare wellness visit in one year.  AVS printed and given to the pharmacy.      Health Maintenance, Male Adopting a healthy lifestyle and getting preventive care are important in promoting health and wellness. Ask your health care provider about: The right schedule for you to have regular tests and exams. Things you can do on your own to prevent diseases and keep yourself healthy. What should I know about diet, weight, and exercise? Eat a healthy diet  Eat a diet that includes plenty of vegetables, fruits, low-fat dairy products, and lean protein. Do not eat a lot of foods that are high in solid fats, added sugars, or sodium. Maintain a healthy weight Body mass index (BMI) is a measurement that can be used to identify possible weight problems. It estimates body fat based on height and weight. Your health care provider can help determine your BMI and help you achieve or maintain a healthy weight. Get regular exercise Get regular exercise. This is one of the most important things you can do for your health. Most adults should: Exercise for at least 150 minutes each week. The exercise should increase your heart rate and make you sweat (moderate-intensity exercise). Do strengthening exercises at least twice a week. This is in addition to the moderate-intensity exercise. Spend less time  sitting. Even light physical activity can be beneficial. Watch cholesterol and blood lipids Have your blood tested for lipids and cholesterol at 71 years of age, then have this test every 5 years. You may need to have your cholesterol levels checked more often if: Your lipid or cholesterol levels are high. You are older than 71 years of age. You are at high risk for heart  disease. What should I know about cancer screening? Many types of cancers can be detected early and may often be prevented. Depending on your health history and family history, you may need to have cancer screening at various ages. This may include screening for: Colorectal cancer. Prostate cancer. Skin cancer. Lung cancer. What should I know about heart disease, diabetes, and high blood pressure? Blood pressure and heart disease High blood pressure causes heart disease and increases the risk of stroke. This is more likely to develop in people who have high blood pressure readings or are overweight. Talk with your health care provider about your target blood pressure readings. Have your blood pressure checked: Every 3-5 years if you are 75-31 years of age. Every year if you are 75 years old or older. If you are between the ages of 47 and 76 and are a current or former smoker, ask your health care provider if you should have a one-time screening for abdominal aortic aneurysm (AAA). Diabetes Have regular diabetes screenings. This checks your fasting blood sugar level. Have the screening done: Once every three years after age 67 if you are at a normal weight and have a low risk for diabetes. More often and at a younger age if you are overweight or have a high risk for diabetes. What should I know about preventing infection? Hepatitis B If you have a higher risk for hepatitis B, you should be screened for this virus. Talk with your health care provider to find out if you are at risk for hepatitis B infection. Hepatitis C Blood testing is recommended for: Everyone born from [birth year] through 1964. Anyone with known risk factors for hepatitis C. Sexually transmitted infections (STIs) You should be screened each year for STIs, including gonorrhea and chlamydia, if: You are sexually active and are younger than 71 years of age. You are older than 71 years of age and your health care provider tells you  that you are at risk for this type of infection. Your sexual activity has changed since you were last screened, and you are at increased risk for chlamydia or gonorrhea. Ask your health care provider if you are at risk. Ask your health care provider about whether you are at high risk for HIV. Your health care provider may recommend a prescription medicine to help prevent HIV infection. If you choose to take medicine to prevent HIV, you should first get tested for HIV. You should then be tested every 3 months for as long as you are taking the medicine. Follow these instructions at home: Alcohol use Do not drink alcohol if your health care provider tells you not to drink. If you drink alcohol: Limit how much you have to 0-2 drinks a day. Know how much alcohol is in your drink. In the [place] one drink equals one 12 oz bottle of beer (355 mL), one 5 oz glass of wine (148 mL), or one 1 oz glass of hard liquor (44 mL). Lifestyle Do not use any products that contain nicotine or tobacco. These products include cigarettes, chewing tobacco, and vaping devices, such as e-cigarettes.  If you need help quitting, ask your health care provider. Do not use street drugs. Do not share needles. Ask your health care provider for help if you need support or information about quitting drugs. General instructions Schedule regular health, dental, and eye exams. Stay current with your vaccines. Tell your health care provider if: You often feel depressed. You have ever been abused or do not feel safe at home. Summary Adopting a healthy lifestyle and getting preventive care are important in promoting health and wellness. Follow your health care provider's instructions about healthy diet, exercising, and getting tested or screened for diseases. Follow your health care provider's instructions on monitoring your cholesterol and blood pressure. This information is not intended to replace advice given to you by your health  care provider. Make sure you discuss any questions you have with your health care provider. Document Revised: 07/01/2020 Document Reviewed: 07/01/2020 Elsevier Patient Education  2023 [employer].

## 2022-06-10 NOTE — Telephone Encounter (Signed)
Referral placed.

## 2022-06-10 NOTE — Progress Notes (Addendum)
MEDICARE ANNUAL WELLNESS VISIT  06/10/2022  Subjective:  Keith Moses is a 72 y.o. male patient of Thekkekandam, Ihor Austin, MD who had a Medicare Annual Wellness Visit today. [name] is Retired and lives with their spouse. he has 2 step children. he reports that he is socially active and does interact with friends/family regularly. he is moderately physically active and enjoys golf (but not able to due it anymore) woodworking, gardening and painting.  Patient Care Team: Monica Becton, MD as PCP - General (Sports Medicine) Gabriel Carina, [facility] as Pharmacist (Pharmacist)     06/10/2022    9:59 AM 08/06/2020    1:20 PM  Advanced Directives  Does Patient Have a Medical Advance Directive? Yes Yes  Type of Advance Directive Living will Living will;Healthcare Power of Attorney  Does patient want to make changes to medical advance directive? No - Patient declined No - Patient declined  Copy of Healthcare Power of Attorney in Chart?  No - copy requested    Hospital Utilization Over the Past 12 Months: # of hospitalizations or ER visits: 4 # of surgeries: 2  Review of Systems    Patient reports that his overall health is  slightly worse due to herniated disc in neck and back  when compared to last year.  Review of Systems: History obtained from chart review and the patient  All other systems negative.  Pain Assessment Pain : 0-10 Pain Score: 2  Pain Type: Chronic pain Pain Location: Foot Pain Orientation: Left, Right Pain Descriptors / Indicators: Tingling Pain Onset: More than a month ago Pain Frequency: Constant Pain Relieving Factors: mild relief with tramdol  Pain Relieving Factors: mild relief with tramdol  Current Medications & Allergies (verified) Allergies as of 06/10/2022       Reactions   Doxycycline Nausea And Vomiting        Medication List        Accurate as of June 10, 2022 11:07 AM. If you have any questions, ask your nurse or doctor.           aspirin 81 MG tablet Take 81 mg by mouth daily.   atorvastatin 40 MG tablet Commonly known as: LIPITOR Take 1 tablet (40 mg total) by mouth daily.   calcipotriene-betamethasone ointment Commonly known as: Taclonex Apply topically daily.   chlorpheniramine-HYDROcodone 10-8 MG/5ML Commonly known as: TUSSIONEX Take 5 mLs by mouth every 12 (twelve) hours as needed for cough (cough, will cause drowsiness.).   ferrous sulfate 325 (65 FE) MG EC tablet Take 1 tablet (325 mg total) by mouth 3 (three) times daily with meals.   gabapentin 300 MG capsule Commonly known as: NEURONTIN TAKE 1 CAPSULE BY MOUTH DAILY AS NEEDED   hydrocortisone 2.5 % cream Apply topically 2 (two) times daily.   meloxicam 15 MG tablet Commonly known as: MOBIC TAKE 1/2 TO 1 TABLET BY MOUTH  WITH DINNER   Multivitamin Adults 50+ Tabs Take 1 tablet by mouth daily.   sertraline 25 MG tablet Commonly known as: ZOLOFT TAKE 1 TABLET BY MOUTH  DAILY   tamsulosin 0.4 MG Caps capsule Commonly known as: FLOMAX TAKE 1 CAPSULE BY MOUTH  DAILY AFTER BREAKFAST   traMADol 50 MG tablet Commonly known as: ULTRAM   valsartan 160 MG tablet Commonly known as: DIOVAN Take 0.5 tablets (80 mg total) by mouth 2 (two) times daily.   varenicline 1 MG tablet Commonly known as: Chantix Take 1 tablet (1 mg total) by mouth 2 (two) times daily.  Varenicline Tartrate (Starter) 0.5 MG X 11 & 1 MG X 42 Tbpk Commonly known as: Chantix Starting Month Pak Take one 0.5 mg tablet by mouth once daily for 3 days, then increase to one 0.5 mg tablet twice daily for 4 days, then increase to one 1 mg tablet twice daily.        History (reviewed): Past Medical History:  Diagnosis Date   Allergy    Anemia    Anxiety    Arthritis    COPD (chronic obstructive pulmonary disease)    Hyperlipidemia    Hypertension    Past Surgical History:  Procedure Laterality Date   COLON SURGERY  09-03-2021   EYE SURGERY     HERNIA REPAIR      Family History  Problem Relation Age of Onset   Hypertension Mother    Cancer Mother    Cancer Father        prostate and stomach CA   Heart disease Brother    Social History   Socioeconomic History   Marital status: Married    Spouse name: Conal Shetley   Number of children: 2   Years of education: 13   Highest education level: Some college, no degree  Occupational History    Comment: Retired  Tobacco Use   Smoking status: Every Day    Packs/day: 1.00    Years: 35.00    Additional pack years: 0.00    Total pack years: 35.00    Types: Cigarettes   Smokeless tobacco: Never  Vaping Use   Vaping Use: Never used  Substance and Sexual Activity   Alcohol use: Not Currently    Comment: 3-4 beers daily   Drug use: No   Sexual activity: Not on file  Other Topics Concern   Not on file  Social History Narrative   Lives with wife. He has two step children. Enjoys woodworking, gardening and painting.   Social Determinants of Health   Financial Resource Strain: Low Risk  (06/09/2022)   Overall Financial Resource Strain (CARDIA)    Difficulty of Paying Living Expenses: Not hard at all  Food Insecurity: No Food Insecurity (06/09/2022)   Hunger Vital Sign    Worried About Running Out of Food in the Last Year: Never true    Ran Out of Food in the Last Year: Never true  Transportation Needs: No Transportation Needs (06/09/2022)   PRAPARE - Administrator, Civil Service (Medical): No    Lack of Transportation (Non-Medical): No  Physical Activity: Sufficiently Active (06/09/2022)   Exercise Vital Sign    Days of Exercise per Week: 3 days    Minutes of Exercise per Session: 60 min  Stress: Stress Concern Present (06/09/2022)   [employer] of Occupational Health - Occupational Stress Questionnaire    Feeling of Stress : To some extent  Social Connections: Moderately Isolated (06/10/2022)   Social Connection and Isolation Panel [NHANES]    Frequency of Communication  with Friends and Family: Once a week    Frequency of Social Gatherings with Friends and Family: Once a week    Attends Religious Services: 1 to 4 times per year    Active Member of [employer] or Organizations: No    Attends Banker Meetings: Never    Marital Status: Married    Activities of Daily Living    06/09/2022    5:04 AM  In your present state of health, do you have any difficulty performing the following activities:  Hearing? 0  Vision? 0  Difficulty concentrating or making decisions? 0  Walking or climbing stairs? 1  Dressing or bathing? 1  Doing errands, shopping? 0  Preparing Food and eating ? N  Using the Toilet? N  In the past six months, have you accidently leaked urine? Y  Do you have problems with loss of bowel control? N  Managing your Medications? N  Managing your Finances? N  Housekeeping or managing your Housekeeping? N    Patient Education/Literacy How often do you need to have someone help you when you read instructions, pamphlets, or other written materials from your doctor or pharmacy?: 1 - Never What is the last grade level you completed in school?: some college  Exercise Current Exercise Habits: Home exercise routine, Type of exercise: stretching, Time (Minutes): 60, Frequency (Times/Week): 3, Weekly Exercise (Minutes/Week): 180, Intensity: Moderate, Exercise limited by: orthopedic condition(s)  Diet Patient reports consuming  2-3  meals a day and 0 snack(s) a day Patient reports that his primary diet is: Regular Patient reports that she does have regular access to food.   Depression Screen    06/10/2022   10:06 AM 04/18/2021    9:09 AM 08/06/2020    1:20 PM 06/21/2020   10:23 AM 11/10/2016    1:20 PM 10/07/2015    4:17 PM  PHQ 2/9 Scores  PHQ - 2 Score 2 0 0 0 0 0  PHQ- 9 Score 7  0 0  6     Fall Risk    06/10/2022   10:00 AM 06/09/2022    5:04 AM 04/18/2021    9:09 AM 08/06/2020    1:20 PM 06/21/2020   10:20 AM  Fall Risk   Falls  in the past year? 0 0 0 0 0  Number falls in past yr: 0  0 0 0  Injury with Fall? 0  0 0 0  Risk for fall due to : Impaired balance/gait   No Fall Risks   Follow up Falls evaluation completed;Education provided;Falls prevention discussed   Falls evaluation completed      Objective:   BP 128/78 (BP Location: Right Arm, Patient Position: Sitting, Cuff Size: Normal)   Pulse 78   Ht 6\' 4"  (1.93 m)   Wt 177 lb (80.3 kg)   SpO2 100%   BMI 21.55 kg/m   Last Weight  Most recent update: 06/10/2022  9:53 AM    Weight  80.3 kg (177 lb)             Body mass index is 21.55 kg/m.  Hearing/Vision  Kitt did not have difficulty with hearing/understanding during the face-to-face interview Fuller did not have difficulty with his vision during the face-to-face interview Reports that he has had a formal eye exam by an eye care professional within the past year Reports that he has not had a formal hearing evaluation within the past year  Cognitive Function:    06/10/2022   10:15 AM 08/06/2020    1:26 PM  6CIT Screen  What Year? 0 points 0 points  What month? 0 points 0 points  What time? 0 points 0 points  Count back from 20 0 points 0 points  Months in reverse 0 points 2 points  Repeat phrase 0 points 0 points  Total Score 0 points 2 points    Normal Cognitive Function Screening: Yes (Normal:0-7, Significant for Dysfunction: >8)  Immunization & Health Maintenance Record Immunization History  Administered Date(s) Administered   Fluad Quad(high  Dose 65+) 12/28/2018, 11/27/2019, 01/30/2022   Influenza Split 01/06/2012, 01/12/2013   Influenza, High Dose Seasonal PF 01/06/2018   Influenza,inj,Quad PF,6+ Mos 10/20/2013   Influenza-Unspecified 11/23/2012, 11/24/2015, 01/23/2021   PFIZER(Purple Top)SARS-COV-2 Vaccination 04/07/2019, 05/03/2019, 12/23/2019, 12/19/2020   PNEUMOCOCCAL CONJUGATE-20 08/06/2020   Pneumococcal Polysaccharide-23 01/14/2018   Tdap 11/14/2010, 01/24/2012    Zoster, Live 01/10/2016    Health Maintenance  Topic Date Due   DTaP/Tdap/Td (3 - Td or Tdap) 01/23/2022   COVID-19 Vaccine (5 - 2023-24 season) 06/26/2022 (Originally 10/24/2021)   Zoster Vaccines- Shingrix (1 of 2) 09/09/2022 (Originally [birth year]/1970)   Lung Cancer Screening  06/10/2023 (Originally 01/15/2019)   Hepatitis C Screening  06/10/2023 (Originally 08/05/1969)   INFLUENZA VACCINE  09/24/2022   Medicare Annual Wellness (AWV)  06/10/2023   COLONOSCOPY (Pts 45-30yrs Insurance coverage will need to be confirmed)  05/20/2028   Pneumonia Vaccine 54+ Years old  Completed   HPV VACCINES  Aged Out   Fecal DNA (Cologuard)  Discontinued       Assessment  This is a routine wellness examination for [employer].  Health Maintenance: Due or Overdue Health Maintenance Due  Topic Date Due   DTaP/Tdap/Td (3 - Td or Tdap) 01/23/2022    Gerrit Heck does not need a referral for Community Assistance: Care Management:   no Social Work:    no Prescription Assistance:  no Nutrition/Diabetes Education:  no   Plan:  Personalized Goals  Goals Addressed               This Visit's Progress     Patient Stated (pt-stated)        Patient stated he want to be able to enjoy activities and be pain free.       Personalized Health Maintenance & Screening Recommendations  Td vaccine Shingles vaccine  Lung Cancer Screening Recommended: no because patient CT angio chest at [facility] in November. (Low Dose CT Chest recommended if Age 64-80 years, 30 pack-year currently smoking OR have quit w/in past 15 years) Hepatitis C Screening recommended: yes HIV Screening recommended: no  Advanced Directives: Written information was not given per the patient's request.  Referrals & Orders No orders of the defined types were placed in this encounter.   Follow-up Plan Follow-up with Monica Becton, MD as planned Schedule shingles and tetanus vaccine at the pharmacy. Medicare wellness visit  in one year.  AVS printed and given to the pharmacy.   I have personally reviewed and noted the following in the patient's chart:   Medical and social history Use of alcohol, tobacco or illicit drugs  Current medications and supplements Functional ability and status Nutritional status Physical activity Advanced directives List of other physicians Hospitalizations, surgeries, and ER visits in previous 12 months Vitals Screenings to include cognitive, depression, and falls Referrals and appointments  In addition, I have reviewed and discussed with patient certain preventive protocols, quality metrics, and best practice recommendations. A written personalized care plan for preventive services as well as general preventive health recommendations were provided to patient.     Modesto Charon, RN BSN  06/10/2022

## 2022-06-12 ENCOUNTER — Ambulatory Visit (INDEPENDENT_AMBULATORY_CARE_PROVIDER_SITE_OTHER): Payer: Medicare Other | Admitting: Sports Medicine

## 2022-06-12 DIAGNOSIS — M4802 Spinal stenosis, cervical region: Secondary | ICD-10-CM | POA: Insufficient documentation

## 2022-06-12 DIAGNOSIS — E785 Hyperlipidemia, unspecified: Secondary | ICD-10-CM | POA: Diagnosis not present

## 2022-06-12 DIAGNOSIS — Z87440 Personal history of urinary (tract) infections: Secondary | ICD-10-CM

## 2022-06-12 DIAGNOSIS — D509 Iron deficiency anemia, unspecified: Secondary | ICD-10-CM

## 2022-06-12 DIAGNOSIS — E7439 Other disorders of intestinal carbohydrate absorption: Secondary | ICD-10-CM | POA: Diagnosis not present

## 2022-06-12 DIAGNOSIS — M48062 Spinal stenosis, lumbar region with neurogenic claudication: Secondary | ICD-10-CM

## 2022-06-12 DIAGNOSIS — N321 Vesicointestinal fistula: Secondary | ICD-10-CM | POA: Diagnosis not present

## 2022-06-12 DIAGNOSIS — R7309 Other abnormal glucose: Secondary | ICD-10-CM | POA: Diagnosis not present

## 2022-06-12 DIAGNOSIS — R8290 Unspecified abnormal findings in urine: Secondary | ICD-10-CM | POA: Diagnosis not present

## 2022-06-12 NOTE — Assessment & Plan Note (Signed)
Keith Moses does have some symptoms consistent with cervical myelopathy including a positive Hoffmann reflex as well as incoordination of his legs, he describes it as his legs feel like they are a few steps behind the rest of his body. Dr. Franky Macho did suggest cervical decompression, I would agree this is appropriate. We could also certainly try a cervical epidural. Leslee will let me know.

## 2022-06-12 NOTE — Assessment & Plan Note (Signed)
Known lumbar spinal stenosis, he did stop his Neurontin, he has seen Dr. Franky Macho who suggested surgery. L4-L5 interlaminar epidural x 2 was not effective. Tramadol was slightly effective, he is hesitant to do tramadol, he is interested in complementary and alternative treatments such as CBD or THC, I think it is fine for him to try some CBD followed by tramadol if not effective, I have recommended against THC which is illegal here.

## 2022-06-12 NOTE — Assessment & Plan Note (Signed)
Off of iron now, rechecking iron indices.

## 2022-06-12 NOTE — Progress Notes (Addendum)
    Procedures performed today:    None.  Independent interpretation of notes and tests performed by another provider:   None.  Brief History, Exam, Impression, and Recommendations:    Iron deficiency anemia Off of iron now, rechecking iron indices.   Lumbar spinal stenosis Known lumbar spinal stenosis, he did stop his Neurontin , he has seen Dr. Gillie who suggested surgery. L4-L5 interlaminar epidural x 2 was not effective. Tramadol  was slightly effective, he is hesitant to do tramadol , he is interested in complementary and alternative treatments such as CBD or THC, I think it is fine for him to try some CBD followed by tramadol  if not effective, I have recommended against THC which is illegal here.   Cervical spinal stenosis Cayton does have some symptoms consistent with cervical myelopathy including a positive Hoffmann reflex as well as incoordination of his legs, he describes it as his legs feel like they are a few steps behind the rest of his body. Dr. Gillie did suggest cervical decompression, I would agree this is appropriate. We could also certainly try a cervical epidural. Anthone will let me know.  Colovesical fistula Please see prior notes for further details, he did have a colovesicular fistula which was taken down with robotic sigmoidectomy, he does still get occasional episodes of prostatitis, most recent urine culture is growing out Klebsiella, pansensitive but the best MIC is with Cipro , adding Cipro  for 10 to 14 days.  I spent 40 minutes of total time managing this patient today, this includes chart review, face to face, and non-face to face time.  ____________________________________________ Debby PARAS. Curtis, M.D., ABFM., CAQSM., AME. Primary Care and Sports Medicine [facility]  Adjunct Professor of [facility] Medicine  [employer]  Restaurant Manager, [employer]

## 2022-06-15 ENCOUNTER — Encounter: Payer: Self-pay | Admitting: Sports Medicine

## 2022-06-15 DIAGNOSIS — Z8619 Personal history of other infectious and parasitic diseases: Secondary | ICD-10-CM | POA: Insufficient documentation

## 2022-06-15 LAB — LIPID PANEL
Cholesterol: 202 mg/dL — ABNORMAL HIGH (ref <200)
HDL: 82 mg/dL (ref >=40)
LDL Cholesterol (Calc): 103 mg/dL (calc) — ABNORMAL HIGH
Non-HDL Cholesterol (Calc): 120 mg/dL (calc) (ref <130)
Total CHOL/HDL Ratio: 2.5 (calc) (ref <5.0)
Triglycerides: 77 mg/dL (ref <150)

## 2022-06-15 LAB — URINALYSIS W MICROSCOPIC + REFLEX CULTURE
Bilirubin Urine: NEGATIVE
Glucose, UA: NEGATIVE
Hyaline Cast: NONE SEEN /LPF
Ketones, ur: NEGATIVE
Nitrites, Initial: NEGATIVE
Protein, ur: NEGATIVE
RBC / HPF: NONE SEEN /HPF (ref 0–2)
Specific Gravity, Urine: 1.008 (ref 1.001–1.035)
Squamous Epithelial / HPF: NONE SEEN /HPF (ref <=5)
pH: 6.5 (ref 5.0–8.0)

## 2022-06-15 LAB — IRON,TIBC AND FERRITIN PANEL
%SAT: 37 % (calc) (ref 20–48)
Ferritin: 67 ng/mL (ref 24–380)
Iron: 119 ug/dL (ref 50–180)
TIBC: 319 mcg/dL (calc) (ref 250–425)

## 2022-06-15 LAB — PSA, TOTAL AND FREE
PSA, % Free: 15 % (calc) — ABNORMAL LOW (ref >25)
PSA, Free: 0.3 ng/mL
PSA, Total: 2 ng/mL (ref <=4.0)

## 2022-06-15 LAB — B12 AND FOLATE PANEL
Folate: >24 ng/mL
Vitamin B-12: 441 pg/mL (ref 200–1100)

## 2022-06-15 LAB — COMPREHENSIVE METABOLIC PANEL
AG Ratio: 1.7 (calc) (ref 1.0–2.5)
ALT: 18 U/L (ref 9–46)
AST: 22 U/L (ref 10–35)
Albumin: 4.4 g/dL (ref 3.6–5.1)
Alkaline phosphatase (APISO): 91 U/L (ref 35–144)
BUN: 25 mg/dL (ref 7–25)
CO2: 23 mmol/L (ref 20–32)
Calcium: 9.6 mg/dL (ref 8.6–10.3)
Chloride: 106 mmol/L (ref 98–110)
Creat: 0.96 mg/dL (ref 0.70–1.28)
Globulin: 2.6 g/dL (calc) (ref 1.9–3.7)
Glucose, Bld: 90 mg/dL (ref 65–99)
Potassium: 4.2 mmol/L (ref 3.5–5.3)
Sodium: 140 mmol/L (ref 135–146)
Total Bilirubin: 0.4 mg/dL (ref 0.2–1.2)
Total Protein: 7 g/dL (ref 6.1–8.1)

## 2022-06-15 LAB — CBC
HCT: 42.7 % (ref 38.5–50.0)
Hemoglobin: 14.5 g/dL (ref 13.2–17.1)
MCH: 31.4 pg (ref 27.0–33.0)
MCHC: 34 g/dL (ref 32.0–36.0)
MCV: 92.4 fL (ref 80.0–100.0)
MPV: 9.2 fL (ref 7.5–12.5)
Platelets: 231 10*3/uL (ref 140–400)
RBC: 4.62 10*6/uL (ref 4.20–5.80)
RDW: 13.1 % (ref 11.0–15.0)
WBC: 5.9 10*3/uL (ref 3.8–10.8)

## 2022-06-15 LAB — HEMOGLOBIN A1C
Hgb A1c MFr Bld: 6.2 % of total Hgb — ABNORMAL HIGH (ref <5.7)
Mean Plasma Glucose: 131 mg/dL
eAG (mmol/L): 7.3 mmol/L

## 2022-06-15 LAB — URINE CULTURE
MICRO NUMBER:: 14852089
SPECIMEN QUALITY:: ADEQUATE

## 2022-06-15 LAB — CULTURE INDICATED

## 2022-06-15 MED ORDER — CIPROFLOXACIN HCL 500 MG PO TABS: 500.0000 mg | ORAL_TABLET | Freq: Two times a day (BID) | ORAL | 0 refills | Status: AC

## 2022-06-15 MED ORDER — VANCOMYCIN HCL 125 MG PO CAPS: 125.0000 mg | ORAL_CAPSULE | Freq: Four times a day (QID) | ORAL | 0 refills | Status: AC

## 2022-06-15 NOTE — Addendum Note (Signed)
Addended by: Monica Becton on: 06/15/2022 12:01 PM   Modules accepted: Orders

## 2022-06-15 NOTE — Assessment & Plan Note (Signed)
Starting Cipro, we will add vancomycin due to his history of C. difficile.

## 2022-06-15 NOTE — Assessment & Plan Note (Signed)
Please see prior notes for further details, he did have a colovesicular fistula which was taken down with robotic sigmoidectomy, he does still get occasional episodes of prostatitis, most recent urine culture is growing out Klebsiella, pansensitive but the best MIC is with Cipro, adding Cipro for 10 to 14 days.

## 2022-06-19 ENCOUNTER — Encounter (INDEPENDENT_AMBULATORY_CARE_PROVIDER_SITE_OTHER): Payer: Medicare Other | Admitting: Sports Medicine

## 2022-06-19 DIAGNOSIS — N321 Vesicointestinal fistula: Secondary | ICD-10-CM

## 2022-06-21 ENCOUNTER — Other Ambulatory Visit: Payer: Self-pay | Admitting: Sports Medicine

## 2022-06-21 DIAGNOSIS — E785 Hyperlipidemia, unspecified: Secondary | ICD-10-CM

## 2022-06-22 ENCOUNTER — Encounter: Payer: Medicare Other | Admitting: Family Medicine

## 2022-06-22 ENCOUNTER — Ambulatory Visit (INDEPENDENT_AMBULATORY_CARE_PROVIDER_SITE_OTHER): Payer: Medicare Other | Admitting: Family Medicine

## 2022-06-22 VITALS — BP 136/70 | Ht 76.0 in | Wt 180.0 lb

## 2022-06-22 DIAGNOSIS — M722 Plantar fascial fibromatosis: Secondary | ICD-10-CM | POA: Diagnosis not present

## 2022-06-22 NOTE — Progress Notes (Signed)
  Keith Moses - 72 y.o. male MRN [id number]  Date of birth: 1950-03-28  SUBJECTIVE:  Including CC & ROS.  No chief complaint on file.   Keith Moses is a 72 y.o. male that is presenting with acute on chronic foot pain.  The pain is occurring in the heel of his feet.  He has a history of similar pain.  Has done well with previous orthotics.    Review of Systems See HPI   HISTORY: Past Medical, Surgical, Social, and Family History Reviewed & Updated per EMR.   Pertinent Historical Findings include:  Past Medical History:  Diagnosis Date   Allergy    Anemia    Anxiety    Arthritis    COPD (chronic obstructive pulmonary disease) (HCC)    Hyperlipidemia    Hypertension     Past Surgical History:  Procedure Laterality Date   COLON SURGERY  09-03-2021   EYE SURGERY     HERNIA REPAIR       PHYSICAL EXAM:  VS: BP 136/70   Ht 6\' 4"  (1.93 m)   Wt 180 lb (81.6 kg)   BMI 21.91 kg/m  Physical Exam Gen: NAD, alert, cooperative with exam, well-appearing MSK:  Neurovascularly intact    Patient was fitted for a standard, cushioned, semi-rigid orthotic. The orthotic was heated and afterward the patient stood on the orthotic blank positioned on the orthotic stand. The patient was positioned in subtalar neutral position and 10 degrees of ankle dorsiflexion in a weight bearing stance. After completion of molding, a stable base was applied to the orthotic blank. The blank was ground to a stable position for weight bearing. Size: 14 Pairs: 2 Base: Blue EVA Additional Posting and Padding: None The patient ambulated these, and they were very comfortable.    ASSESSMENT & PLAN:   Plantar fasciitis, right Acute on chronic in nature.  Has pain in the heel that is worse with prolonged standing or walking. -Counseled on home exercise therapy and supportive care. -Orthotics. -Could consider adding metatarsal pads

## 2022-06-22 NOTE — Assessment & Plan Note (Signed)
Acute on chronic in nature.  Has pain in the heel that is worse with prolonged standing or walking. -Counseled on home exercise therapy and supportive care. -Orthotics. -Could consider adding metatarsal pads

## 2022-06-30 ENCOUNTER — Other Ambulatory Visit: Payer: Self-pay | Admitting: Sports Medicine

## 2022-06-30 DIAGNOSIS — M19041 Primary osteoarthritis, right hand: Secondary | ICD-10-CM

## 2022-07-02 DIAGNOSIS — R002 Palpitations: Secondary | ICD-10-CM | POA: Diagnosis not present

## 2022-07-02 DIAGNOSIS — I447 Left bundle-branch block, unspecified: Secondary | ICD-10-CM | POA: Diagnosis not present

## 2022-07-02 DIAGNOSIS — E7800 Pure hypercholesterolemia, unspecified: Secondary | ICD-10-CM | POA: Diagnosis not present

## 2022-07-02 DIAGNOSIS — F17200 Nicotine dependence, unspecified, uncomplicated: Secondary | ICD-10-CM | POA: Diagnosis not present

## 2022-07-02 DIAGNOSIS — I10 Essential (primary) hypertension: Secondary | ICD-10-CM | POA: Diagnosis not present

## 2022-07-07 NOTE — Telephone Encounter (Signed)
I spent 5 total minutes of online digital evaluation and management services in this patient-initiated request for online care. 

## 2022-07-08 DIAGNOSIS — N321 Vesicointestinal fistula: Secondary | ICD-10-CM | POA: Diagnosis not present

## 2022-07-09 LAB — URINALYSIS W MICROSCOPIC + REFLEX CULTURE
Bacteria, UA: NONE SEEN /HPF
Bilirubin Urine: NEGATIVE
Glucose, UA: NEGATIVE
Hgb urine dipstick: NEGATIVE
Hyaline Cast: NONE SEEN /LPF
Ketones, ur: NEGATIVE
Leukocyte Esterase: NEGATIVE
Nitrites, Initial: NEGATIVE
Protein, ur: NEGATIVE
RBC / HPF: NONE SEEN /HPF (ref 0–2)
Specific Gravity, Urine: 1.01 (ref 1.001–1.035)
Squamous Epithelial / HPF: NONE SEEN /HPF (ref <=5)
WBC, UA: NONE SEEN /HPF (ref 0–5)
pH: 6.5 (ref 5.0–8.0)

## 2022-07-09 LAB — NO CULTURE INDICATED

## 2022-07-23 ENCOUNTER — Other Ambulatory Visit: Payer: Self-pay | Admitting: Sports Medicine

## 2022-07-23 DIAGNOSIS — F411 Generalized anxiety disorder: Secondary | ICD-10-CM

## 2022-08-12 ENCOUNTER — Encounter (INDEPENDENT_AMBULATORY_CARE_PROVIDER_SITE_OTHER): Payer: Medicare Other | Admitting: Sports Medicine

## 2022-08-12 DIAGNOSIS — M48062 Spinal stenosis, lumbar region with neurogenic claudication: Secondary | ICD-10-CM

## 2022-08-12 MED ORDER — TRAMADOL HCL 50 MG PO TABS: 50.0000 mg | ORAL_TABLET | Freq: Two times a day (BID) | ORAL | 4 refills | Status: DC | PRN

## 2022-08-12 NOTE — Telephone Encounter (Signed)
I spent 5 total minutes of online digital evaluation and management services in this patient-initiated request for online care. 

## 2022-09-02 DIAGNOSIS — Z48812 Encounter for surgical aftercare following surgery on the circulatory system: Secondary | ICD-10-CM | POA: Diagnosis not present

## 2022-09-02 DIAGNOSIS — I739 Peripheral vascular disease, unspecified: Secondary | ICD-10-CM | POA: Diagnosis not present

## 2022-09-30 ENCOUNTER — Encounter: Payer: Self-pay | Admitting: Sports Medicine

## 2022-09-30 DIAGNOSIS — M48062 Spinal stenosis, lumbar region with neurogenic claudication: Secondary | ICD-10-CM

## 2022-10-02 DIAGNOSIS — M5416 Radiculopathy, lumbar region: Secondary | ICD-10-CM | POA: Diagnosis not present

## 2022-10-02 DIAGNOSIS — R2681 Unsteadiness on feet: Secondary | ICD-10-CM | POA: Diagnosis not present

## 2022-10-02 DIAGNOSIS — M5412 Radiculopathy, cervical region: Secondary | ICD-10-CM | POA: Diagnosis not present

## 2022-10-12 DIAGNOSIS — M4727 Other spondylosis with radiculopathy, lumbosacral region: Secondary | ICD-10-CM | POA: Diagnosis not present

## 2022-10-12 DIAGNOSIS — M5416 Radiculopathy, lumbar region: Secondary | ICD-10-CM | POA: Diagnosis not present

## 2022-10-12 DIAGNOSIS — M5114 Intervertebral disc disorders with radiculopathy, thoracic region: Secondary | ICD-10-CM | POA: Diagnosis not present

## 2022-10-12 DIAGNOSIS — M5117 Intervertebral disc disorders with radiculopathy, lumbosacral region: Secondary | ICD-10-CM | POA: Diagnosis not present

## 2022-10-12 DIAGNOSIS — R2681 Unsteadiness on feet: Secondary | ICD-10-CM | POA: Diagnosis not present

## 2022-10-12 DIAGNOSIS — M4802 Spinal stenosis, cervical region: Secondary | ICD-10-CM | POA: Diagnosis not present

## 2022-10-12 DIAGNOSIS — M4726 Other spondylosis with radiculopathy, lumbar region: Secondary | ICD-10-CM | POA: Diagnosis not present

## 2022-10-12 DIAGNOSIS — M4724 Other spondylosis with radiculopathy, thoracic region: Secondary | ICD-10-CM | POA: Diagnosis not present

## 2022-10-12 DIAGNOSIS — M5412 Radiculopathy, cervical region: Secondary | ICD-10-CM | POA: Diagnosis not present

## 2022-10-12 DIAGNOSIS — M4722 Other spondylosis with radiculopathy, cervical region: Secondary | ICD-10-CM | POA: Diagnosis not present

## 2022-10-12 DIAGNOSIS — M5116 Intervertebral disc disorders with radiculopathy, lumbar region: Secondary | ICD-10-CM | POA: Diagnosis not present

## 2022-10-30 DIAGNOSIS — M4722 Other spondylosis with radiculopathy, cervical region: Secondary | ICD-10-CM | POA: Diagnosis not present

## 2022-10-30 DIAGNOSIS — M4312 Spondylolisthesis, cervical region: Secondary | ICD-10-CM | POA: Diagnosis not present

## 2022-10-30 DIAGNOSIS — M5412 Radiculopathy, cervical region: Secondary | ICD-10-CM | POA: Diagnosis not present

## 2022-12-03 DIAGNOSIS — M4726 Other spondylosis with radiculopathy, lumbar region: Secondary | ICD-10-CM | POA: Diagnosis not present

## 2022-12-03 DIAGNOSIS — M5459 Other low back pain: Secondary | ICD-10-CM | POA: Diagnosis not present

## 2022-12-03 DIAGNOSIS — M48061 Spinal stenosis, lumbar region without neurogenic claudication: Secondary | ICD-10-CM | POA: Diagnosis not present

## 2022-12-03 DIAGNOSIS — M51361 Other intervertebral disc degeneration, lumbar region with lower extremity pain only: Secondary | ICD-10-CM | POA: Diagnosis not present

## 2022-12-03 DIAGNOSIS — G8929 Other chronic pain: Secondary | ICD-10-CM | POA: Diagnosis not present

## 2022-12-03 DIAGNOSIS — M7918 Myalgia, other site: Secondary | ICD-10-CM | POA: Diagnosis not present

## 2022-12-03 DIAGNOSIS — I739 Peripheral vascular disease, unspecified: Secondary | ICD-10-CM | POA: Diagnosis not present

## 2022-12-03 DIAGNOSIS — M48062 Spinal stenosis, lumbar region with neurogenic claudication: Secondary | ICD-10-CM | POA: Diagnosis not present

## 2023-01-05 DIAGNOSIS — M51361 Other intervertebral disc degeneration, lumbar region with lower extremity pain only: Secondary | ICD-10-CM | POA: Diagnosis not present

## 2023-01-05 DIAGNOSIS — M48061 Spinal stenosis, lumbar region without neurogenic claudication: Secondary | ICD-10-CM | POA: Diagnosis not present

## 2023-01-28 ENCOUNTER — Ambulatory Visit (INDEPENDENT_AMBULATORY_CARE_PROVIDER_SITE_OTHER): Payer: Medicare Other | Admitting: Sports Medicine

## 2023-01-28 ENCOUNTER — Encounter: Payer: Self-pay | Admitting: Sports Medicine

## 2023-01-28 VITALS — BP 111/68 | HR 69 | Ht 76.0 in | Wt 170.0 lb

## 2023-01-28 DIAGNOSIS — R7303 Prediabetes: Secondary | ICD-10-CM | POA: Diagnosis not present

## 2023-01-28 DIAGNOSIS — N139 Obstructive and reflux uropathy, unspecified: Secondary | ICD-10-CM | POA: Diagnosis not present

## 2023-01-28 DIAGNOSIS — F411 Generalized anxiety disorder: Secondary | ICD-10-CM | POA: Diagnosis not present

## 2023-01-28 DIAGNOSIS — Z0000 Encounter for general adult medical examination without abnormal findings: Secondary | ICD-10-CM

## 2023-01-28 DIAGNOSIS — E041 Nontoxic single thyroid nodule: Secondary | ICD-10-CM | POA: Diagnosis not present

## 2023-01-28 DIAGNOSIS — I10 Essential (primary) hypertension: Secondary | ICD-10-CM

## 2023-01-28 DIAGNOSIS — M48062 Spinal stenosis, lumbar region with neurogenic claudication: Secondary | ICD-10-CM | POA: Diagnosis not present

## 2023-01-28 DIAGNOSIS — M5416 Radiculopathy, lumbar region: Secondary | ICD-10-CM | POA: Diagnosis not present

## 2023-01-28 DIAGNOSIS — E785 Hyperlipidemia, unspecified: Secondary | ICD-10-CM | POA: Diagnosis not present

## 2023-01-28 HISTORY — DX: Nontoxic single thyroid nodule: E04.1

## 2023-01-28 MED ORDER — ATORVASTATIN CALCIUM 40 MG PO TABS: 40.0000 mg | ORAL_TABLET | Freq: Every day | ORAL | 3 refills | Status: DC

## 2023-01-28 MED ORDER — SERTRALINE HCL 50 MG PO TABS: 50.0000 mg | ORAL_TABLET | Freq: Every day | ORAL | 3 refills | Status: DC

## 2023-01-28 MED ORDER — GABAPENTIN 300 MG PO CAPS: 300.0000 mg | ORAL_CAPSULE | Freq: Every day | ORAL | 2 refills | Status: DC | PRN

## 2023-01-28 MED ORDER — VALSARTAN 80 MG PO TABS: 80.0000 mg | ORAL_TABLET | Freq: Two times a day (BID) | ORAL | 3 refills | Status: DC

## 2023-01-28 MED ORDER — TAMSULOSIN HCL 0.4 MG PO CAPS: 0.4000 mg | ORAL_CAPSULE | Freq: Two times a day (BID) | ORAL | 3 refills | Status: DC

## 2023-01-28 NOTE — Assessment & Plan Note (Signed)
Keith Moses does get orthostatic, we are going to drop his Diovan down to 80 mg.

## 2023-01-28 NOTE — Assessment & Plan Note (Signed)
Incidentally noted thyroid nodule, adding an ultrasound.

## 2023-01-28 NOTE — Progress Notes (Addendum)
    Procedures performed today:    None.  Independent interpretation of notes and tests performed by another provider:   None.  Brief History, Exam, Impression, and Recommendations:    Essential hypertension, benign Diomar does get orthostatic, we are going to drop his Diovan down to 80 mg.  Generalized anxiety disorder Currently low-dose Zoloft, increasing anxiety, increasing Zoloft to 50 mg daily, we can do a 6 to 8-week recheck.  Lumbar spinal stenosis Lumbar spinal stenosis, chronic back pain, multilevel fusion recommended by Dr. Franky Macho, he is now working with Dr. Laurian Brim, he has had a couple of epidurals, it sounds like tramadol has been effective. He got a refill from Dr. Laurian Brim, I am happy to take this over, 50 mg 2-3 times daily, I can refill it when he asks.  Obstructive uropathy Prostatitis, increasing urinary hesitancy, frequency. We will check labs including urinalysis and PSA. I will also increase his tamsulosin to twice daily, we are decreasing his Diovan to compensate. He did try Cialis and it did not help.  Annual physical exam We did not do an annual physical today as he had multiple problems to discuss. We will bring him back in 6 weeks to follow-up his urination and mood, we can potentially knock out some more screening measures at that time.  Thyroid nodule Incidentally noted thyroid nodule, adding an ultrasound.   ____________________________________________ Ihor Austin. Benjamin Stain, M.D., ABFM., CAQSM., AME. Primary Care and Sports Medicine [facility]  Adjunct Professor of Family Medicine  [employer]  Restaurant manager, fast food

## 2023-01-28 NOTE — Assessment & Plan Note (Signed)
We did not do an annual physical today as he had multiple problems to discuss. We will bring him back in 6 weeks to follow-up his urination and mood, we can potentially knock out some more screening measures at that time.

## 2023-01-28 NOTE — Assessment & Plan Note (Signed)
Prostatitis, increasing urinary hesitancy, frequency. We will check labs including urinalysis and PSA. I will also increase his tamsulosin to twice daily, we are decreasing his Diovan to compensate. He did try Cialis and it did not help.

## 2023-01-28 NOTE — Assessment & Plan Note (Signed)
Currently low-dose Zoloft, increasing anxiety, increasing Zoloft to 50 mg daily, we can do a 6 to 8-week recheck.

## 2023-01-28 NOTE — Assessment & Plan Note (Signed)
Lumbar spinal stenosis, chronic back pain, multilevel fusion recommended by Dr. Franky Macho, he is now working with Dr. Laurian Brim, he has had a couple of epidurals, it sounds like tramadol has been effective. He got a refill from Dr. Laurian Brim, I am happy to take this over, 50 mg 2-3 times daily, I can refill it when he asks.

## 2023-01-28 NOTE — Addendum Note (Signed)
Addended by: Monica Becton on: 01/28/2023 03:12 PM   Modules accepted: Orders

## 2023-01-28 NOTE — Addendum Note (Signed)
Addended by: Carren Rang A on: 01/28/2023 03:12 PM   Modules accepted: Orders

## 2023-01-29 LAB — HEMOGLOBIN A1C
Est. average glucose Bld gHb Est-mCnc: 120 mg/dL
Hgb A1c MFr Bld: 5.8 % — ABNORMAL HIGH (ref 4.8–5.6)

## 2023-01-29 LAB — COMPREHENSIVE METABOLIC PANEL
ALT: 19 [IU]/L (ref 0–44)
AST: 23 [IU]/L (ref 0–40)
Albumin: 4.6 g/dL (ref 3.8–4.8)
Alkaline Phosphatase: 110 [IU]/L (ref 44–121)
BUN/Creatinine Ratio: 18 (ref 10–24)
BUN: 21 mg/dL (ref 8–27)
Bilirubin Total: 0.3 mg/dL (ref 0.0–1.2)
CO2: 22 mmol/L (ref 20–29)
Calcium: 9.6 mg/dL (ref 8.6–10.2)
Chloride: 106 mmol/L (ref 96–106)
Creatinine, Ser: 1.15 mg/dL (ref 0.76–1.27)
Globulin, Total: 2.6 g/dL (ref 1.5–4.5)
Glucose: 93 mg/dL (ref 70–99)
Potassium: 4.5 mmol/L (ref 3.5–5.2)
Sodium: 144 mmol/L (ref 134–144)
Total Protein: 7.2 g/dL (ref 6.0–8.5)
eGFR: 68 mL/min/{1.73_m2} (ref >59)

## 2023-01-29 LAB — CBC
Hematocrit: 45.6 % (ref 37.5–51.0)
Hemoglobin: 15.1 g/dL (ref 13.0–17.7)
MCH: 32.5 pg (ref 26.6–33.0)
MCHC: 33.1 g/dL (ref 31.5–35.7)
MCV: 98 fL — ABNORMAL HIGH (ref 79–97)
Platelets: 216 10*3/uL (ref 150–450)
RBC: 4.65 x10E6/uL (ref 4.14–5.80)
RDW: 13 % (ref 11.6–15.4)
WBC: 6.3 10*3/uL (ref 3.4–10.8)

## 2023-01-29 LAB — UA/M W/RFLX CULTURE, ROUTINE
Bilirubin, UA: NEGATIVE
Glucose, UA: NEGATIVE
Ketones, UA: NEGATIVE
Leukocytes,UA: NEGATIVE
Nitrite, UA: NEGATIVE
Protein,UA: NEGATIVE
Specific Gravity, UA: 1.018 (ref 1.005–1.030)
Urobilinogen, Ur: 0.2 mg/dL (ref 0.2–1.0)
pH, UA: 6.5 (ref 5.0–7.5)

## 2023-01-29 LAB — MICROSCOPIC EXAMINATION
Bacteria, UA: NONE SEEN
Casts: NONE SEEN /[LPF]
Epithelial Cells (non renal): NONE SEEN /[HPF] (ref 0–10)
RBC, Urine: NONE SEEN /[HPF] (ref 0–2)
WBC, UA: NONE SEEN /[HPF] (ref 0–5)

## 2023-01-29 LAB — PSA, TOTAL AND FREE
PSA, Free Pct: 17.1 %
PSA, Free: 0.29 ng/mL
Prostate Specific Ag, Serum: 1.7 ng/mL (ref 0.0–4.0)

## 2023-02-01 ENCOUNTER — Ambulatory Visit (INDEPENDENT_AMBULATORY_CARE_PROVIDER_SITE_OTHER): Payer: Medicare Other

## 2023-02-01 DIAGNOSIS — E041 Nontoxic single thyroid nodule: Secondary | ICD-10-CM | POA: Diagnosis not present

## 2023-02-08 ENCOUNTER — Other Ambulatory Visit: Payer: Self-pay | Admitting: Sports Medicine

## 2023-02-08 DIAGNOSIS — I10 Essential (primary) hypertension: Secondary | ICD-10-CM

## 2023-02-23 ENCOUNTER — Other Ambulatory Visit: Payer: Self-pay | Admitting: Sports Medicine

## 2023-02-23 DIAGNOSIS — F411 Generalized anxiety disorder: Secondary | ICD-10-CM

## 2023-03-11 ENCOUNTER — Ambulatory Visit (INDEPENDENT_AMBULATORY_CARE_PROVIDER_SITE_OTHER): Payer: Medicare Other | Admitting: Sports Medicine

## 2023-03-11 VITALS — BP 151/64 | HR 88 | Ht 76.0 in | Wt 175.0 lb

## 2023-03-11 DIAGNOSIS — N139 Obstructive and reflux uropathy, unspecified: Secondary | ICD-10-CM

## 2023-03-11 DIAGNOSIS — M48062 Spinal stenosis, lumbar region with neurogenic claudication: Secondary | ICD-10-CM | POA: Diagnosis not present

## 2023-03-11 DIAGNOSIS — F411 Generalized anxiety disorder: Secondary | ICD-10-CM

## 2023-03-11 DIAGNOSIS — E785 Hyperlipidemia, unspecified: Secondary | ICD-10-CM

## 2023-03-11 DIAGNOSIS — I10 Essential (primary) hypertension: Secondary | ICD-10-CM

## 2023-03-11 MED ORDER — TRAMADOL HCL 50 MG PO TABS: 50.0000 mg | ORAL_TABLET | Freq: Two times a day (BID) | ORAL | 1 refills | Status: DC

## 2023-03-11 NOTE — Assessment & Plan Note (Signed)
Keith Moses will return for fasting lipids

## 2023-03-11 NOTE — Assessment & Plan Note (Signed)
Multilevel lumbar spinal stenosis, multilevel fusion recommended by Dr. Franky Macho, he does have a caudal epidural coming up with Dr. Laurian Brim and then they will discuss spinal cord stimulator. Keith Moses does have some lack of balance with his lower extremities, I do think this is coming from his lumbar spine, he understands that spinal cord stimulator will likely improve his pain but not necessarily his instability, I will take over his tramadol, refilling 50 mg twice daily. He will keep further discussion of his back pain with Dr. Franky Macho and Dr. Laurian Brim. He is also thinking about seeing a Land.

## 2023-03-11 NOTE — Assessment & Plan Note (Signed)
We started low-dose Zoloft, he is currently taking 50 mg once daily, we will go up to 100 mg daily and we can recheck this in about 6 weeks. He understands that Zoloft cannot be taken as needed, and he needs to be on the dose consistently.

## 2023-03-11 NOTE — Assessment & Plan Note (Signed)
Blood pressure continues to be elevated on Diovan 80 mg once daily, he did stop his second dose of Flomax. We will go up to Diovan 80 mg twice daily. He can let me know his blood pressure readings at home over the next several weeks.

## 2023-03-11 NOTE — Progress Notes (Signed)
    Procedures performed today:    None.  Independent interpretation of notes and tests performed by another provider:   None.  Brief History, Exam, Impression, and Recommendations:    Generalized anxiety disorder We started low-dose Zoloft, he is currently taking 50 mg once daily, we will go up to 100 mg daily and we can recheck this in about 6 weeks. He understands that Zoloft cannot be taken as needed, and he needs to be on the dose consistently.  Lumbar spinal stenosis Multilevel lumbar spinal stenosis, multilevel fusion recommended by Dr. Franky Macho, he does have a caudal epidural coming up with Dr. Laurian Brim and then they will discuss spinal cord stimulator. Ed does have some lack of balance with his lower extremities, I do think this is coming from his lumbar spine, he understands that spinal cord stimulator will likely improve his pain but not necessarily his instability, I will take over his tramadol, refilling 50 mg twice daily. He will keep further discussion of his back pain with Dr. Franky Macho and Dr. Laurian Brim. He is also thinking about seeing a [facility].  Obstructive uropathy Unable to tolerate Flomax twice daily, dropping down to once daily. He did get orthostasis  Essential hypertension, benign Blood pressure continues to be elevated on Diovan 80 mg once daily, he did stop his second dose of Flomax. We will go up to Diovan 80 mg twice daily. He can let me know his blood pressure readings at home over the next several weeks.  Hyperlipidemia Baylie will return for fasting lipids    ____________________________________________ Ihor Austin. Benjamin Stain, M.D., ABFM., CAQSM., AME. Primary Care and Sports Medicine [facility]  Adjunct Professor of Family Medicine  [place] [employer]  Restaurant manager, fast food

## 2023-03-11 NOTE — Assessment & Plan Note (Signed)
Unable to tolerate Flomax twice daily, dropping down to once daily. He did get orthostasis

## 2023-03-12 ENCOUNTER — Other Ambulatory Visit: Payer: Self-pay | Admitting: Sports Medicine

## 2023-03-12 DIAGNOSIS — M19041 Primary osteoarthritis, right hand: Secondary | ICD-10-CM

## 2023-03-22 DIAGNOSIS — E785 Hyperlipidemia, unspecified: Secondary | ICD-10-CM | POA: Diagnosis not present

## 2023-03-23 ENCOUNTER — Encounter: Payer: Self-pay | Admitting: Sports Medicine

## 2023-03-23 LAB — LIPID PANEL
Chol/HDL Ratio: 2.1 {ratio} (ref 0.0–5.0)
Cholesterol, Total: 193 mg/dL (ref 100–199)
HDL: 92 mg/dL (ref >39)
LDL Chol Calc (NIH): 91 mg/dL (ref 0–99)
Triglycerides: 52 mg/dL (ref 0–149)
VLDL Cholesterol Cal: 10 mg/dL (ref 5–40)

## 2023-04-21 DIAGNOSIS — M47816 Spondylosis without myelopathy or radiculopathy, lumbar region: Secondary | ICD-10-CM | POA: Diagnosis not present

## 2023-04-21 DIAGNOSIS — M48061 Spinal stenosis, lumbar region without neurogenic claudication: Secondary | ICD-10-CM | POA: Diagnosis not present

## 2023-04-22 ENCOUNTER — Ambulatory Visit (INDEPENDENT_AMBULATORY_CARE_PROVIDER_SITE_OTHER): Payer: Medicare Other | Admitting: Sports Medicine

## 2023-04-22 VITALS — BP 130/66 | HR 76 | Temp 98.6°F | Resp 20 | Ht 76.0 in | Wt 174.0 lb

## 2023-04-22 DIAGNOSIS — M48062 Spinal stenosis, lumbar region with neurogenic claudication: Secondary | ICD-10-CM | POA: Diagnosis not present

## 2023-04-22 DIAGNOSIS — N321 Vesicointestinal fistula: Secondary | ICD-10-CM | POA: Diagnosis not present

## 2023-04-22 DIAGNOSIS — I10 Essential (primary) hypertension: Secondary | ICD-10-CM

## 2023-04-22 DIAGNOSIS — I739 Peripheral vascular disease, unspecified: Secondary | ICD-10-CM

## 2023-04-22 DIAGNOSIS — L409 Psoriasis, unspecified: Secondary | ICD-10-CM | POA: Diagnosis not present

## 2023-04-22 DIAGNOSIS — F411 Generalized anxiety disorder: Secondary | ICD-10-CM

## 2023-04-22 MED ORDER — CLOBETASOL PROPIONATE 0.05 % EX OINT: 1.0000 | TOPICAL_OINTMENT | Freq: Two times a day (BID) | CUTANEOUS | 11 refills | Status: AC

## 2023-04-22 MED ORDER — TRAMADOL HCL 50 MG PO TABS: 50.0000 mg | ORAL_TABLET | Freq: Three times a day (TID) | ORAL | 1 refills | Status: AC | PRN

## 2023-04-22 NOTE — Assessment & Plan Note (Signed)
 Keith Moses is status post colovesicular fistula takedown with robotic sigmoidectomy. He still does get occasional episodes of prostatitis. He is also complaining of an abdominal heaviness. He points to the lower abdomen and pelvis. He thinks it is coming from his hips. I suspect the pelvic process is likely more related to his prostate, he may need his urologist again, or he needs to go back to his colorectal surgeon. He would like to keep this on the back burner for now.

## 2023-04-22 NOTE — Assessment & Plan Note (Signed)
 Blood pressure is under good control.

## 2023-04-22 NOTE — Progress Notes (Signed)
    Procedures performed today:    None.  Independent interpretation of notes and tests performed by another provider:   None.  Brief History, Exam, Impression, and Recommendations:    Generalized anxiety disorder Keith Moses is still taking 50 mg of Zoloft, at the last visit we did ask him to go up to 100 mg, he feels okay on 50 mg so we will leave this alone for now.  Lumbar spinal stenosis Multilevel lumbar spondylosis, he had a multilevel fusion recommended by Dr. Franky Macho, he did have a caudal epidural with Dr. Laurian Brim, he has improved. He is still concerned about numbness and tingling running down the legs, as well as in the calfs. I did inform him that the only way to determine exactly where this was coming from, be it radicular or peripheral neuropathic was nerve conduction and EMG. He did see a chiropractor who suggested that a temperature change in his legs was related to neuropathy and not vascular pathology. They also suggested almost a $9000 treatment protocol. He is not going to proceed. Lumbar decompression such as with physical therapy or an inversion table will continue to be very beneficial. He does have a referral from Dr. Laurian Brim to see a neurologist for nerve conduction and EMG. We will hold off on the serum workup and let the neurologist do this. He would like a refill on tramadol for use 3 times daily.  Peripheral arterial disease (HCC) Does have known PAD, he does have multiple areas of stenosis based on ABI. I think this is what the chiropractor was seeing with the temperature test. I have suggested he be cautious with chiropractors particularly if they think an extremity temperature difference is related to neuropathy rather than vasculopathy. He does have a consultation with vascular surgery so we will leave this alone for now. I do suspect his calf cramping may be a combination of neurogenic and vascular.  Psoriasis and eczematous dermatitis Continue Taclonex  occasionally, he does have eczematous changes on the palms, adding topical clobetasol ointment for this.  Essential hypertension, benign Blood pressure is under good control.  Colovesical fistula Abou is status post colovesicular fistula takedown with robotic sigmoidectomy. He still does get occasional episodes of prostatitis. He is also complaining of an abdominal heaviness. He points to the lower abdomen and pelvis. He thinks it is coming from his hips. I suspect the pelvic process is likely more related to his prostate, he may need his urologist again, or he needs to go back to his colorectal surgeon. He would like to keep this on the back burner for now.  I spent 40 minutes of total time managing this patient today, this includes chart review, face to face, and non-face to face time.  Multiple, multiple medical problems were discussed today.  ____________________________________________ Keith Moses. Keith Moses, M.D., ABFM., CAQSM., AME. Primary Care and Sports Medicine [facility]  Adjunct Professor of Family Medicine  [place] [employer] [employer]  Restaurant manager, fast food

## 2023-04-22 NOTE — Assessment & Plan Note (Signed)
 Keith Moses is still taking 50 mg of Zoloft, at the last visit we did ask him to go up to 100 mg, he feels okay on 50 mg so we will leave this alone for now.

## 2023-04-22 NOTE — Assessment & Plan Note (Signed)
 Multilevel lumbar spondylosis, he had a multilevel fusion recommended by Dr. Franky Macho, he did have a caudal epidural with Dr. Laurian Brim, he has improved. He is still concerned about numbness and tingling running down the legs, as well as in the calfs. I did inform him that the only way to determine exactly where this was coming from, be it radicular or peripheral neuropathic was nerve conduction and EMG. He did see a chiropractor who suggested that a temperature change in his legs was related to neuropathy and not vascular pathology. They also suggested almost a $9000 treatment protocol. He is not going to proceed. Lumbar decompression such as with physical therapy or an inversion table will continue to be very beneficial. He does have a referral from Dr. Laurian Brim to see a neurologist for nerve conduction and EMG. We will hold off on the serum workup and let the neurologist do this. He would like a refill on tramadol for use 3 times daily.

## 2023-04-22 NOTE — Assessment & Plan Note (Signed)
 Continue Taclonex occasionally, he does have eczematous changes on the palms, adding topical clobetasol ointment for this.

## 2023-04-22 NOTE — Assessment & Plan Note (Signed)
 Does have known PAD, he does have multiple areas of stenosis based on ABI. I think this is what the chiropractor was seeing with the temperature test. I have suggested he be cautious with chiropractors particularly if they think an extremity temperature difference is related to neuropathy rather than vasculopathy. He does have a consultation with vascular surgery so we will leave this alone for now. I do suspect his calf cramping may be a combination of neurogenic and vascular.

## 2023-04-29 ENCOUNTER — Other Ambulatory Visit: Payer: Self-pay | Admitting: Sports Medicine

## 2023-04-29 DIAGNOSIS — N139 Obstructive and reflux uropathy, unspecified: Secondary | ICD-10-CM

## 2023-04-30 DIAGNOSIS — E0844 Diabetes mellitus due to underlying condition with diabetic amyotrophy: Secondary | ICD-10-CM | POA: Diagnosis not present

## 2023-04-30 DIAGNOSIS — R202 Paresthesia of skin: Secondary | ICD-10-CM | POA: Diagnosis not present

## 2023-06-03 DIAGNOSIS — I739 Peripheral vascular disease, unspecified: Secondary | ICD-10-CM | POA: Diagnosis not present

## 2023-06-03 DIAGNOSIS — M4726 Other spondylosis with radiculopathy, lumbar region: Secondary | ICD-10-CM | POA: Diagnosis not present

## 2023-06-03 DIAGNOSIS — M48062 Spinal stenosis, lumbar region with neurogenic claudication: Secondary | ICD-10-CM | POA: Diagnosis not present

## 2023-06-03 DIAGNOSIS — M5459 Other low back pain: Secondary | ICD-10-CM | POA: Diagnosis not present

## 2023-06-03 DIAGNOSIS — G629 Polyneuropathy, unspecified: Secondary | ICD-10-CM | POA: Diagnosis not present

## 2023-06-03 DIAGNOSIS — G894 Chronic pain syndrome: Secondary | ICD-10-CM | POA: Diagnosis not present

## 2023-06-08 ENCOUNTER — Other Ambulatory Visit: Payer: Self-pay | Admitting: Sports Medicine

## 2023-06-08 DIAGNOSIS — F411 Generalized anxiety disorder: Secondary | ICD-10-CM

## 2023-06-15 ENCOUNTER — Ambulatory Visit (INDEPENDENT_AMBULATORY_CARE_PROVIDER_SITE_OTHER): Payer: Medicare Other

## 2023-06-15 ENCOUNTER — Encounter: Payer: Self-pay | Admitting: Sports Medicine

## 2023-06-15 ENCOUNTER — Other Ambulatory Visit: Payer: Self-pay

## 2023-06-15 VITALS — BP 127/68 | HR 83 | Ht 76.0 in | Wt 170.0 lb

## 2023-06-15 DIAGNOSIS — F411 Generalized anxiety disorder: Secondary | ICD-10-CM

## 2023-06-15 DIAGNOSIS — E785 Hyperlipidemia, unspecified: Secondary | ICD-10-CM

## 2023-06-15 DIAGNOSIS — Z0000 Encounter for general adult medical examination without abnormal findings: Secondary | ICD-10-CM | POA: Diagnosis not present

## 2023-06-15 DIAGNOSIS — I10 Essential (primary) hypertension: Secondary | ICD-10-CM

## 2023-06-15 MED ORDER — ATORVASTATIN CALCIUM 40 MG PO TABS: 40.0000 mg | ORAL_TABLET | Freq: Every day | ORAL | 3 refills | Status: AC

## 2023-06-15 MED ORDER — VALSARTAN 80 MG PO TABS: 80.0000 mg | ORAL_TABLET | Freq: Two times a day (BID) | ORAL | 1 refills | Status: DC

## 2023-06-15 MED ORDER — SERTRALINE HCL 50 MG PO TABS: 50.0000 mg | ORAL_TABLET | Freq: Every day | ORAL | 1 refills | Status: AC

## 2023-06-15 NOTE — Progress Notes (Signed)
 Subjective:   Keith Moses is a 73 y.o. male who presents for Medicare Annual/Subsequent preventive examination.  Visit Complete: In person  Patient Medicare AWV questionnaire was completed by the patient on 06/01/2023; I have confirmed that all information answered by patient is correct and no changes since this date.  Cardiac Risk Factors include: advanced age (>27men, >33 women);male gender;smoking/ tobacco exposure;hypertension;dyslipidemia;family history of premature cardiovascular disease     Objective:    Today's Vitals   06/15/23 0951 06/15/23 1016  BP: 132/69   Pulse: 83   SpO2: 99%   Weight: 170 lb (77.1 kg)   Height: 6\' 4"  (1.93 m)   PainSc:  3    Body mass index is 20.69 kg/m.     06/15/2023   10:42 AM 06/10/2022    9:59 AM 08/06/2020    1:20 PM  Advanced Directives  Does Patient Have a Medical Advance Directive? Yes Yes Yes  Type of Estate agent of [place];Living will Living will Living will;Healthcare Power of Attorney  Does patient want to make changes to medical advance directive? No - Patient declined No - Patient declined No - Patient declined  Copy of Healthcare Power of Attorney in Chart? No - copy requested  No - copy requested    Current Medications (verified) Outpatient Encounter Medications as of 06/15/2023  Medication Sig   aspirin 81 MG tablet Take 81 mg by mouth daily.   atorvastatin  (LIPITOR) 40 MG tablet Take 1 tablet (40 mg total) by mouth daily.   calcipotriene -betamethasone  (TACLONEX) ointment Apply topically daily.   clobetasol  ointment (TEMOVATE ) 0.05 % Apply 1 Application topically 2 (two) times daily. For use on hands   cyanocobalamin  (VITAMIN B12) 1000 MCG tablet Take 1,000 mcg by mouth daily.   gabapentin  (NEURONTIN ) 300 MG capsule Take 300 mg by mouth 2 (two) times daily.   hydrocortisone 2.5 % cream Apply topically 2 (two) times daily.   sertraline  (ZOLOFT ) 50 MG tablet TAKE 1 TABLET BY MOUTH EVERY DAY    tamsulosin  (FLOMAX ) 0.4 MG CAPS capsule TAKE 1 CAPSULE (0.4 MG TOTAL) BY MOUTH IN THE MORNING AND AT BEDTIME (Patient taking differently: Take 0.4 mg by mouth daily after breakfast.)   traMADol  (ULTRAM ) 50 MG tablet Take 1 tablet (50 mg total) by mouth 3 (three) times daily as needed.   valsartan  (DIOVAN ) 80 MG tablet TAKE 1 TABLET BY MOUTH 2 TIMES DAILY.   Multiple Vitamins-Minerals (MULTIVITAMIN ADULTS 50+) TABS Take 1 tablet by mouth daily. (Patient not taking: Reported on 06/15/2023)   [DISCONTINUED] ferrous sulfate  325 (65 FE) MG EC tablet Take 1 tablet (325 mg total) by mouth 3 (three) times daily with meals.   [DISCONTINUED] meloxicam  (MOBIC ) 15 MG tablet TAKE 1/2 TO 1 TABLET BY MOUTH  DAILY WITH DINNER (Patient not taking: Reported on 06/15/2023)   No facility-administered encounter medications on file as of 06/15/2023.    Allergies (verified) Doxycycline    History: Past Medical History:  Diagnosis Date   Allergy    Anemia    Anxiety    Arthritis    COPD (chronic obstructive pulmonary disease) (HCC)    Hyperlipidemia    Hypertension    Thyroid  nodule 01/28/2023   Past Surgical History:  Procedure Laterality Date   COLON SURGERY  09-03-2021   EYE SURGERY     HERNIA REPAIR     Family History  Problem Relation Age of Onset   Hypertension Mother    Cancer Mother    Cancer Father  prostate and stomach CA   Heart disease Brother    Social History   Socioeconomic History   Marital status: Married    Spouse name: Keith Moses   Number of children: 2   Years of education: 13   Highest education level: Some college, no degree  Occupational History    Comment: Retired  Tobacco Use   Smoking status: Every Day    Current packs/day: 1.00    Average packs/day: 1 pack/day for 46.0 years (46.0 ttl pk-yrs)    Types: Cigarettes    Start date: 06/29/1977   Smokeless tobacco: Never  Vaping Use   Vaping status: Never Used  Substance and Sexual Activity   Alcohol use: Not  Currently    Comment: 3-4 beers daily   Drug use: No   Sexual activity: Not on file  Other Topics Concern   Not on file  Social History Narrative   Lives with wife. He has two step children. Enjoys woodworking, gardening and painting.   Social Drivers of Health   Financial Resource Strain: High Risk (06/15/2023)   Overall Financial Resource Strain (CARDIA)    Difficulty of Paying Living Expenses: Hard  Food Insecurity: No Food Insecurity (06/15/2023)   Hunger Vital Sign    Worried About Running Out of Food in the Last Year: Never true    Ran Out of Food in the Last Year: Never true  Transportation Needs: No Transportation Needs (06/15/2023)   PRAPARE - Administrator, Civil Service (Medical): No    Lack of Transportation (Non-Medical): No  Physical Activity: Sufficiently Active (06/15/2023)   Exercise Vital Sign    Days of Exercise per Week: 5 days    Minutes of Exercise per Session: 40 min  Stress: No Stress Concern Present (06/15/2023)   [employer] of Occupational Health - Occupational Stress Questionnaire    Feeling of Stress : Not at all  Social Connections: Socially Isolated (06/15/2023)   Social Connection and Isolation Panel [NHANES]    Frequency of Communication with Friends and Family: Once a week    Frequency of Social Gatherings with Friends and Family: Once a week    Attends Religious Services: Never    Database administrator or Organizations: No    Attends Engineer, structural: Never    Marital Status: Married    Tobacco Counseling Ready to quit: Not Answered Counseling given: Not Answered   Clinical Intake:  Pre-visit preparation completed: Yes  Pain : 0-10 Pain Score: 3  Pain Type: Chronic pain Pain Location: Back Pain Orientation: Lower Pain Descriptors / Indicators: Constant Pain Onset: More than a month ago Pain Frequency: Constant Pain Relieving Factors: pain medication.  Pain Relieving Factors: pain medication.  BMI  - recorded: 20.69 Nutritional Status: BMI of 19-24  Normal Nutritional Risks: None Diabetes: No  How often do you need to have someone help you when you read instructions, pamphlets, or other written materials from your doctor or pharmacy?: 1 - Never What is the last grade level you completed in school?: 13  Interpreter Needed?: No      Activities of Daily Living    06/15/2023   10:21 AM 06/12/2023    5:21 AM  In your present state of health, do you have any difficulty performing the following activities:  Hearing? 0 0  Vision? 0 0  Difficulty concentrating or making decisions? 0 0  Walking or climbing stairs? 0 0  Dressing or bathing? 0 0  Doing errands,  shopping? 0 0  Preparing Food and eating ? N N  Using the Toilet? N N  In the past six months, have you accidently leaked urine? N N  Do you have problems with loss of bowel control? N N  Managing your Medications? N N  Managing your Finances? N N  Housekeeping or managing your Housekeeping? N N    Patient Care Team: Gean Keels, MD as PCP - General (Sports Medicine) Domenica Fried, MD as Referring Physician (Pain Medicine)  Indicate any recent Medical Services you may have received from other than [facility] providers in the past year (date may be approximate).     Assessment:   This is a routine wellness examination for Keith Moses.  Hearing/Vision screen No results found.   Goals Addressed             This Visit's Progress    Patient Stated       He would like to be more active. He hopes his back pain will be better after surgery.       Depression Screen    06/15/2023   10:39 AM 06/10/2022   10:06 AM 04/18/2021    9:09 AM 08/06/2020    1:20 PM 06/21/2020   10:23 AM 11/10/2016    1:20 PM 10/07/2015    4:17 PM  PHQ 2/9 Scores  PHQ - 2 Score 0 2 0 0 0 0 0  PHQ- 9 Score  7  0 0  6    Fall Risk    06/15/2023   10:45 AM 06/12/2023    5:21 AM 06/10/2022   10:00 AM 06/09/2022    5:04 AM 04/18/2021    9:09  AM  Fall Risk   Falls in the past year? 0 1 0 0 0  Number falls in past yr: 0  0  0  Injury with Fall? 0 0 0  0  Risk for fall due to : No Fall Risks  Impaired balance/gait    Follow up   Falls evaluation completed;Education provided;Falls prevention discussed      MEDICARE RISK AT HOME: Medicare Risk at Home Any stairs in or around the home?: No If so, are there any without handrails?: No Home free of loose throw rugs in walkways, pet beds, electrical cords, etc?: Yes Adequate lighting in your home to reduce risk of falls?: Yes Life alert?: No Use of a cane, walker or w/c?: No Grab bars in the bathroom?: No Shower chair or bench in shower?: No Elevated toilet seat or a handicapped toilet?: No  TIMED UP AND GO:  Was the test performed?  Yes  Length of time to ambulate 10 feet: 8 sec Gait steady and fast without use of assistive device    Cognitive Function:        06/15/2023   10:45 AM 06/10/2022   10:15 AM 08/06/2020    1:26 PM  6CIT Screen  What Year? 0 points 0 points 0 points  What month? 0 points 0 points 0 points  What time? 0 points 0 points 0 points  Count back from 20 0 points 0 points 0 points  Months in reverse 0 points 0 points 2 points  Repeat phrase 0 points 0 points 0 points  Total Score 0 points 0 points 2 points    Immunizations Immunization History  Administered Date(s) Administered   Fluad Quad(high Dose 65+) 12/28/2018, 11/27/2019, 01/30/2022   Influenza Split 01/06/2012, 01/12/2013   Influenza, High Dose Seasonal PF 01/06/2018, 12/31/2022  Influenza,inj,Quad PF,6+ Mos 10/20/2013, 12/31/2022   Influenza-Unspecified 11/23/2012, 11/24/2015, 01/23/2021   Moderna Covid-19 Vaccine Bivalent Booster 82yrs & up 11/10/2022   PFIZER(Purple Top)SARS-COV-2 Vaccination 04/07/2019, 05/03/2019, 12/23/2019, 12/19/2020   PNEUMOCOCCAL CONJUGATE-20 08/06/2020   Pneumococcal Polysaccharide-23 01/14/2018   Tdap 11/14/2010, 02/23/2022, 06/11/2022   Zoster, Live  01/10/2016    TDAP status: Up to date  Flu Vaccine status: Up to date  Pneumococcal vaccine status: Up to date  Covid-19 vaccine status: Information provided on how to obtain vaccines.   Qualifies for Shingles Vaccine? Yes   Zostavax completed Yes   Shingrix  Completed?: No.    Education has been provided regarding the importance of this vaccine. Patient has been advised to call insurance company to determine out of pocket expense if they have not yet received this vaccine. Advised may also receive vaccine at local pharmacy or Health Dept. Verbalized acceptance and understanding.  Screening Tests Health Maintenance  Topic Date Due   Hepatitis C Screening  Never done   Zoster Vaccines- Shingrix  (1 of 2) 08[birth year]06/1970   Lung Cancer Screening  01/15/2019   COVID-19 Vaccine (6 - 2024-25 season) 01/05/2023   INFLUENZA VACCINE  09/24/2023   Medicare Annual Wellness (AWV)  06/14/2024   Colonoscopy  05/20/2028   DTaP/Tdap/Td (4 - Td or Tdap) 06/10/2032   Pneumonia Vaccine 9+ Years old  Completed   HPV VACCINES  Aged Out   Meningococcal B Vaccine  Aged Out   Fecal DNA (Cologuard)  Discontinued    Health Maintenance  Health Maintenance Due  Topic Date Due   Hepatitis C Screening  Never done   Zoster Vaccines- Shingrix  (1 of 2) 08/06/1970   Lung Cancer Screening  01/15/2019   COVID-19 Vaccine (6 - 2024-25 season) 01/05/2023    Colorectal cancer screening: Type of screening: Colonoscopy. Completed 05/20/2021. Repeat every 7 years  Lung Cancer Screening: (Low Dose CT Chest recommended if Age 88-80 years, 20 pack-year currently smoking OR have quit w/in 15years.) does qualify.   Lung Cancer Screening Referral: patient doesn't want to have the CT at this time.   Additional Screening:  Hepatitis C Screening: does qualify; Completed Not yet  Vision Screening: Recommended annual ophthalmology exams for early detection of glaucoma and other disorders of the eye. Is the patient up to  date with their annual eye exam?  Yes  Who is the provider or what is the name of the office in which the patient attends annual eye exams? [email or web address] If pt is not established with a provider, would they like to be referred to a provider to establish care?  N/a .   Dental Screening: Recommended annual dental exams for proper oral hygiene    Community Resource Referral / Chronic Care Management: CRR required this visit?  No   CCM required this visit?  No     Plan:     I have personally reviewed and noted the following in the patient's chart:   Medical and social history Use of alcohol, tobacco or illicit drugs  Current medications and supplements including opioid prescriptions. Patient is currently taking opioid prescriptions. Information provided to patient regarding non-opioid alternatives. Patient advised to discuss non-opioid treatment plan with their provider. Functional ability and status Nutritional status Physical activity Advanced directives List of other physicians Hospitalizations, surgeries, and ER visits in previous 12 months Vitals Screenings to include cognitive, depression, and falls Referrals and appointments  In addition, I have reviewed and discussed with patient certain preventive protocols, quality metrics, and best practice recommendations.  A written personalized care plan for preventive services as well as general preventive health recommendations were provided to patient.     Keith Moses, [place]   06/15/2023   After Visit Summary: (In Person-Printed) AVS printed and given to the patient  Nurse Notes:   Keith Moses is a 73 y.o. male patient of Thekkekandam, Joselyn Nicely, MD who had a Medicare Annual Wellness Visit today. Keith Moses is Retired and lives with their spouse. he has 2 step children. He reports that he is socially active and does interact with friends/family regularly. He is moderately physically active and enjoys golf (but not able to due  it anymore) woodworking, gardening and painting.

## 2023-06-15 NOTE — Patient Instructions (Signed)
  [name] , Thank you for taking time to come for your Medicare Wellness Visit. I appreciate your ongoing commitment to your health goals. Please review the following plan we discussed and let me know if I can assist you in the future.   These are the goals we discussed:  Goals       Medication Management      Patient Goals/Self-Care Activities Over the next 365 days, patient will:  take medications as prescribed  Follow Up Plan: The patient has been provided with contact information for the care management team and has been advised to call with any health related questions or concerns.        Patient Stated (pt-stated)      08/06/2020 AWV Goal: Exercise for General Health  Patient will verbalize understanding of the benefits of increased physical activity: Exercising regularly is important. It will improve your overall fitness, flexibility, and endurance. Regular exercise also will improve your overall health. It can help you control your weight, reduce stress, and improve your bone density. Over the next year, patient will increase physical activity as tolerated with a goal of at least 150 minutes of moderate physical activity per week.  You can tell that you are exercising at a moderate intensity if your heart starts beating faster and you start breathing faster but can still hold a conversation. Moderate-intensity exercise ideas include: Walking 1 mile (1.6 km) in about 15 minutes Biking Hiking Golfing Dancing Water aerobics Patient will verbalize understanding of everyday activities that increase physical activity by providing examples like the following: Yard work, such as: Insurance underwriter Gardening Washing windows or floors Patient will be able to explain general safety guidelines for exercising:  Before you start a new exercise program, talk with your health care provider. Do not  exercise so much that you hurt yourself, feel dizzy, or get very short of breath. Wear comfortable clothes and wear shoes with good support. Drink plenty of water while you exercise to prevent dehydration or heat stroke. Work out until your breathing and your heartbeat get faster.       Patient Stated (pt-stated)      Patient stated he want to be able to enjoy activities and be pain free.      Patient Stated      He would like to be more active. He hopes his back pain will be better after surgery.         This is a list of the screening recommended for you and due dates:  Health Maintenance  Topic Date Due   Hepatitis C Screening  Never done   Zoster (Shingles) Vaccine (1 of 2) 08/06/1969   Screening for Lung Cancer  01/15/2019   COVID-19 Vaccine (6 - 2024-25 season) 01/05/2023   Flu Shot  09/24/2023   Medicare Annual Wellness Visit  06/14/2024   Colon Cancer Screening  05/20/2028   DTaP/Tdap/Td vaccine (4 - Td or Tdap) 06/10/2032   Pneumonia Vaccine  Completed   HPV Vaccine  Aged Out   Meningitis B Vaccine  Aged Out   Cologuard (Stool DNA test)  Discontinued

## 2023-06-23 DIAGNOSIS — M5459 Other low back pain: Secondary | ICD-10-CM | POA: Diagnosis not present

## 2023-06-23 DIAGNOSIS — M47816 Spondylosis without myelopathy or radiculopathy, lumbar region: Secondary | ICD-10-CM | POA: Diagnosis not present

## 2023-07-02 DIAGNOSIS — E7800 Pure hypercholesterolemia, unspecified: Secondary | ICD-10-CM | POA: Diagnosis not present

## 2023-07-02 DIAGNOSIS — I10 Essential (primary) hypertension: Secondary | ICD-10-CM | POA: Diagnosis not present

## 2023-07-02 DIAGNOSIS — R002 Palpitations: Secondary | ICD-10-CM | POA: Diagnosis not present

## 2023-07-02 DIAGNOSIS — I739 Peripheral vascular disease, unspecified: Secondary | ICD-10-CM | POA: Diagnosis not present

## 2023-07-02 DIAGNOSIS — I447 Left bundle-branch block, unspecified: Secondary | ICD-10-CM | POA: Diagnosis not present

## 2023-07-06 DIAGNOSIS — R202 Paresthesia of skin: Secondary | ICD-10-CM | POA: Diagnosis not present

## 2023-07-13 DIAGNOSIS — I70212 Atherosclerosis of native arteries of extremities with intermittent claudication, left leg: Secondary | ICD-10-CM | POA: Diagnosis not present

## 2023-07-13 DIAGNOSIS — Z48812 Encounter for surgical aftercare following surgery on the circulatory system: Secondary | ICD-10-CM | POA: Diagnosis not present

## 2023-07-14 DIAGNOSIS — I739 Peripheral vascular disease, unspecified: Secondary | ICD-10-CM | POA: Diagnosis not present

## 2023-07-14 DIAGNOSIS — Z48812 Encounter for surgical aftercare following surgery on the circulatory system: Secondary | ICD-10-CM | POA: Diagnosis not present

## 2023-07-14 DIAGNOSIS — I10 Essential (primary) hypertension: Secondary | ICD-10-CM | POA: Diagnosis not present

## 2023-07-15 DIAGNOSIS — G629 Polyneuropathy, unspecified: Secondary | ICD-10-CM | POA: Diagnosis not present

## 2023-07-15 DIAGNOSIS — I10 Essential (primary) hypertension: Secondary | ICD-10-CM | POA: Diagnosis not present

## 2023-07-21 DIAGNOSIS — M47816 Spondylosis without myelopathy or radiculopathy, lumbar region: Secondary | ICD-10-CM | POA: Diagnosis not present

## 2023-07-21 DIAGNOSIS — M5459 Other low back pain: Secondary | ICD-10-CM | POA: Diagnosis not present

## 2023-08-07 ENCOUNTER — Other Ambulatory Visit: Payer: Self-pay | Admitting: Sports Medicine

## 2023-08-07 DIAGNOSIS — I10 Essential (primary) hypertension: Secondary | ICD-10-CM

## 2023-08-12 DIAGNOSIS — I10 Essential (primary) hypertension: Secondary | ICD-10-CM | POA: Diagnosis not present

## 2023-08-17 DIAGNOSIS — M5459 Other low back pain: Secondary | ICD-10-CM | POA: Diagnosis not present

## 2023-08-17 DIAGNOSIS — M47816 Spondylosis without myelopathy or radiculopathy, lumbar region: Secondary | ICD-10-CM | POA: Diagnosis not present

## 2023-09-20 ENCOUNTER — Other Ambulatory Visit: Payer: Self-pay | Admitting: Sports Medicine

## 2023-09-20 DIAGNOSIS — I10 Essential (primary) hypertension: Secondary | ICD-10-CM

## 2023-10-19 DIAGNOSIS — M4726 Other spondylosis with radiculopathy, lumbar region: Secondary | ICD-10-CM | POA: Diagnosis not present

## 2023-10-19 DIAGNOSIS — M25551 Pain in right hip: Secondary | ICD-10-CM | POA: Diagnosis not present

## 2023-10-19 DIAGNOSIS — I10 Essential (primary) hypertension: Secondary | ICD-10-CM | POA: Diagnosis not present

## 2023-10-19 DIAGNOSIS — M48061 Spinal stenosis, lumbar region without neurogenic claudication: Secondary | ICD-10-CM | POA: Diagnosis not present

## 2023-10-19 DIAGNOSIS — M47816 Spondylosis without myelopathy or radiculopathy, lumbar region: Secondary | ICD-10-CM | POA: Diagnosis not present

## 2023-10-19 DIAGNOSIS — M25552 Pain in left hip: Secondary | ICD-10-CM | POA: Diagnosis not present

## 2023-10-19 DIAGNOSIS — G894 Chronic pain syndrome: Secondary | ICD-10-CM | POA: Diagnosis not present

## 2023-10-19 DIAGNOSIS — M51362 Other intervertebral disc degeneration, lumbar region with discogenic back pain and lower extremity pain: Secondary | ICD-10-CM | POA: Diagnosis not present

## 2023-10-26 ENCOUNTER — Encounter: Payer: Self-pay | Admitting: Sports Medicine

## 2023-10-29 DIAGNOSIS — R52 Pain, unspecified: Secondary | ICD-10-CM | POA: Diagnosis not present

## 2023-10-29 DIAGNOSIS — I10 Essential (primary) hypertension: Secondary | ICD-10-CM | POA: Diagnosis not present

## 2023-10-29 DIAGNOSIS — M25552 Pain in left hip: Secondary | ICD-10-CM | POA: Diagnosis not present

## 2023-10-29 DIAGNOSIS — M160 Bilateral primary osteoarthritis of hip: Secondary | ICD-10-CM | POA: Diagnosis not present

## 2023-10-29 DIAGNOSIS — M25551 Pain in right hip: Secondary | ICD-10-CM | POA: Diagnosis not present

## 2023-11-05 DIAGNOSIS — M160 Bilateral primary osteoarthritis of hip: Secondary | ICD-10-CM | POA: Diagnosis not present

## 2023-11-05 DIAGNOSIS — M25551 Pain in right hip: Secondary | ICD-10-CM | POA: Diagnosis not present

## 2023-11-05 DIAGNOSIS — M25552 Pain in left hip: Secondary | ICD-10-CM | POA: Diagnosis not present

## 2023-11-19 DIAGNOSIS — M1611 Unilateral primary osteoarthritis, right hip: Secondary | ICD-10-CM | POA: Diagnosis not present

## 2023-12-07 DIAGNOSIS — M25542 Pain in joints of left hand: Secondary | ICD-10-CM | POA: Diagnosis not present

## 2023-12-07 DIAGNOSIS — M25562 Pain in left knee: Secondary | ICD-10-CM | POA: Diagnosis not present

## 2023-12-07 DIAGNOSIS — L409 Psoriasis, unspecified: Secondary | ICD-10-CM | POA: Diagnosis not present

## 2023-12-07 DIAGNOSIS — Z79899 Other long term (current) drug therapy: Secondary | ICD-10-CM | POA: Diagnosis not present

## 2023-12-07 DIAGNOSIS — M79642 Pain in left hand: Secondary | ICD-10-CM | POA: Diagnosis not present

## 2023-12-07 DIAGNOSIS — M19072 Primary osteoarthritis, left ankle and foot: Secondary | ICD-10-CM | POA: Diagnosis not present

## 2023-12-07 DIAGNOSIS — M25561 Pain in right knee: Secondary | ICD-10-CM | POA: Diagnosis not present

## 2023-12-07 DIAGNOSIS — M79641 Pain in right hand: Secondary | ICD-10-CM | POA: Diagnosis not present

## 2023-12-07 DIAGNOSIS — M85872 Other specified disorders of bone density and structure, left ankle and foot: Secondary | ICD-10-CM | POA: Diagnosis not present

## 2023-12-07 DIAGNOSIS — M25541 Pain in joints of right hand: Secondary | ICD-10-CM | POA: Diagnosis not present

## 2023-12-07 DIAGNOSIS — M19071 Primary osteoarthritis, right ankle and foot: Secondary | ICD-10-CM | POA: Diagnosis not present

## 2023-12-07 DIAGNOSIS — I10 Essential (primary) hypertension: Secondary | ICD-10-CM | POA: Diagnosis not present

## 2023-12-09 ENCOUNTER — Encounter: Payer: Self-pay | Admitting: Medical-Surgical

## 2023-12-09 ENCOUNTER — Other Ambulatory Visit: Payer: Self-pay

## 2023-12-09 DIAGNOSIS — Z1211 Encounter for screening for malignant neoplasm of colon: Secondary | ICD-10-CM

## 2023-12-10 ENCOUNTER — Telehealth: Payer: Self-pay | Admitting: Medical-Surgical

## 2023-12-10 ENCOUNTER — Telehealth: Payer: Self-pay

## 2023-12-10 NOTE — Telephone Encounter (Signed)
 Copied from CRM [id number]. Topic: General - Other >> Dec 10, 2023  9:03 AM Amy B wrote: Reason for CRM: Patient states he received a letter through MyChart stating he is being sent a Cologuard.  He states he is not supposed to be getting a Cologuard and he requests a call back to discuss as he has several other questions. Please call [phone]

## 2023-12-10 NOTE — Telephone Encounter (Signed)
 Patient called. He needs clarification on why he is being asked to get a cologuard test since he had one in 2023?

## 2023-12-10 NOTE — Telephone Encounter (Signed)
 Did you order him a Cologuard?

## 2023-12-13 NOTE — Telephone Encounter (Signed)
 I spoke with Keith Moses and he doesn't want to proceed with Cologuard. He had a colonoscopy in 2023. This is in his chart. I cancelled the order with [facility]. However I cannot take the order out of the system.
